# Patient Record
Sex: Male | Born: 1955 | Race: White | Hispanic: No | Marital: Married | Smoking: Former smoker
Health system: Southern US, Community
[De-identification: ages and names within clinical notes are randomized; demographics above are authoritative.]

## PROBLEM LIST (undated history)

## (undated) DIAGNOSIS — E781 Pure hyperglyceridemia: Secondary | ICD-10-CM

## (undated) DIAGNOSIS — I639 Cerebral infarction, unspecified: Secondary | ICD-10-CM

## (undated) DIAGNOSIS — R519 Headache, unspecified: Secondary | ICD-10-CM

## (undated) DIAGNOSIS — I10 Essential (primary) hypertension: Secondary | ICD-10-CM

## (undated) DIAGNOSIS — T7840XA Allergy, unspecified, initial encounter: Secondary | ICD-10-CM

## (undated) DIAGNOSIS — I6529 Occlusion and stenosis of unspecified carotid artery: Secondary | ICD-10-CM

## (undated) HISTORY — PX: HERNIA REPAIR: SHX51

## (undated) HISTORY — DX: Pure hyperglyceridemia: E78.1

## (undated) HISTORY — DX: Allergy, unspecified, initial encounter: T78.40XA

## (undated) HISTORY — DX: Occlusion and stenosis of unspecified carotid artery: I65.29

---

## 2008-07-07 HISTORY — PX: EYE SURGERY: SHX253

## 2009-08-27 ENCOUNTER — Ambulatory Visit: Payer: Self-pay | Admitting: Cardiology

## 2009-09-12 ENCOUNTER — Ambulatory Visit: Payer: Self-pay | Admitting: Vascular Surgery

## 2010-04-17 ENCOUNTER — Ambulatory Visit: Payer: Self-pay | Admitting: Vascular Surgery

## 2010-11-19 NOTE — Procedures (Signed)
CAROTID DUPLEX EXAM   INDICATION:  Follow up carotid artery disease.   HISTORY:  Diabetes:  No.  Cardiac:  No.  Hypertension:  Yes.  Smoking:  No.  Previous Surgery:  No.  CV History:  Blurred vision in the left eye.  Passed out at work on  08/26/09.  Amaurosis Fugax , Paresthesias , Hemiparesis                                       RIGHT             LEFT  Brachial systolic pressure:         138               136  Brachial Doppler waveforms:         WNL               WNL  Vertebral direction of flow:        Antegrade         Antegrade  DUPLEX VELOCITIES (cm/sec)  CCA peak systolic                   78                80  ECA peak systolic                   123               124  ICA peak systolic                   157               81  ICA end diastolic                   66                23  PLAQUE MORPHOLOGY:                  Heterogenous      Heterogenous  PLAQUE AMOUNT:                      Moderate          Mild  PLAQUE LOCATION:                    Proximal ICA      Proximal ICA   IMPRESSION:  Bilateral intimal thickening within common carotid  arteries, 40% to 59% stenosis in the right internal carotid artery, 1%  to 39% stenosis in the left internal carotid artery.   ___________________________________________  Janetta Hora Fields, MD   OD/MEDQ  D:  04/17/2010  T:  04/17/2010  Job:  [id number]

## 2010-11-19 NOTE — Assessment & Plan Note (Signed)
OFFICE VISIT   Patrick [name], Patrick [name]  DOB:  [birth year]                                       09/12/2009  KGMWN#:[id number]   CHIEF COMPLAINT:  Dizziness.   HISTORY OF PRESENT ILLNESS:  The patient is a 55 year old male referred  by Dr. Neita Carp who about 3 weeks ago had a syncopal episode.  At that  time he was taking care of his wife who was having a viral GI type  illness.  He said he was fairly run down at the time and also eventually  himself came down with this viral illness.  He had no seizure activity  noticed at the time of his syncopal episode.  He states he was out for  approximately 20 seconds.  He has had no prior similar episodes.  He  denies any episodes of TIA, amaurosis or stroke.  He denies tobacco  abuse.  He has been on aspirin for several years.   CHRONIC MEDICAL PROBLEMS:  Include hypertension and elevated  cholesterol.  These are both currently controlled and followed by Dr.  Neita Carp.   PAST SURGICAL HISTORY:  He had bilateral inguinal hernia repair.   FAMILY HISTORY:  Remarkable for his father who had coronary artery  disease and a myocardial infarction at age 39.   I reviewed several pages of medical records sent by Dr. Neita Carp including  a carotid duplex exam from [facility] which showed  bilateral 40%-60% internal carotid artery stenosis.  The right vertebral  artery had antegrade flow.  The left vertebral artery had antegrade  flow.  These both had normal waveforms.  He also had an echocardiogram  which showed a normal ejection fraction with some impaired diastolic  relaxation and mild LVH.   PHYSICAL EXAM:  Vital signs:  Blood pressure is 136/84 in the right arm,  155/91 in the left arm, temperature is 98.2, heart rate 73 and regular.  HEENT:  Unremarkable.  Neck:  Has 2+ carotid pulses without bruit.  Chest:  Clear to auscultation.  Cardiac:  Exam is regular rate and  rhythm without murmur.  Abdomen:  Soft,  nontender, nondistended.  No  masses.  Extremities:  He has 2+ femoral pulses, 2+ posterior tibial  pulses bilaterally.  He has absent dorsalis pedis pulses bilaterally.  Musculoskeletal:  Exam shows no major joint deformities.  Neurologic:  Exam shows symmetric upper extremity and lower extremity motor strength  which is 5/5 and symmetric.  Sensory is intact.  He has no pronator  drift.  Cranial nerves II-XII are intact.  Skin:  Has no ulcers or  rashes.  Lymphatic:  Exam shows no cervical, axillary or inguinal  lymphadenopathy.   CURRENT MEDICATIONS:  Include quinapril and hydrochlorothiazide.  He  also takes aspirin.   ALLERGIES:  He has no known drug allergies.   In summary, the patient has bilateral moderate carotid stenoses.  I do  not believe these are the cause of his syncopal episode and I agree with  his primary physician that this most likely was related to his viral  illness rather than carotid disease.  However, I think the carotid  stenosis warrants continued followup and will schedule him for a repeat  carotid duplex ultrasound in six months' time to make sure he has no  progression of the disease.  He will continue to take his  aspirin  therapy and try to keep his blood pressure and cholesterol in check.     Janetta Hora. Fields, MD  Electronically Signed   CEF/MEDQ  D:  09/13/2009  T:  09/13/2009  Job:  [id number]   cc:   Fara Chute

## 2011-01-21 DIAGNOSIS — E7800 Pure hypercholesterolemia, unspecified: Secondary | ICD-10-CM | POA: Insufficient documentation

## 2011-04-17 ENCOUNTER — Other Ambulatory Visit: Payer: Self-pay

## 2011-06-02 ENCOUNTER — Other Ambulatory Visit: Payer: Self-pay

## 2011-07-12 ENCOUNTER — Encounter: Payer: Self-pay | Admitting: *Deleted

## 2011-07-12 ENCOUNTER — Other Ambulatory Visit: Payer: Self-pay

## 2011-07-12 ENCOUNTER — Emergency Department (HOSPITAL_COMMUNITY): Payer: BC Managed Care – PPO

## 2011-07-12 ENCOUNTER — Observation Stay (HOSPITAL_COMMUNITY)
Admission: EM | Admit: 2011-07-12 | Discharge: 2011-07-13 | Disposition: A | Discharge status: Home / Self Care | Payer: BC Managed Care – PPO | Attending: Internal Medicine | Admitting: Internal Medicine

## 2011-07-12 DIAGNOSIS — R209 Unspecified disturbances of skin sensation: Secondary | ICD-10-CM | POA: Insufficient documentation

## 2011-07-12 DIAGNOSIS — E876 Hypokalemia: Secondary | ICD-10-CM | POA: Insufficient documentation

## 2011-07-12 DIAGNOSIS — I10 Essential (primary) hypertension: Secondary | ICD-10-CM | POA: Insufficient documentation

## 2011-07-12 DIAGNOSIS — R29810 Facial weakness: Secondary | ICD-10-CM | POA: Insufficient documentation

## 2011-07-12 DIAGNOSIS — R42 Dizziness and giddiness: Secondary | ICD-10-CM | POA: Insufficient documentation

## 2011-07-12 DIAGNOSIS — G459 Transient cerebral ischemic attack, unspecified: Principal | ICD-10-CM | POA: Insufficient documentation

## 2011-07-12 DIAGNOSIS — R7309 Other abnormal glucose: Secondary | ICD-10-CM | POA: Insufficient documentation

## 2011-07-12 HISTORY — DX: Essential (primary) hypertension: I10

## 2011-07-12 LAB — RAPID URINE DRUG SCREEN, HOSP PERFORMED
Barbiturates: NOT DETECTED
Benzodiazepines: NOT DETECTED
Tetrahydrocannabinol: NOT DETECTED

## 2011-07-12 LAB — BASIC METABOLIC PANEL
CO2: 30 mEq/L (ref 19–32)
Chloride: 97 mEq/L (ref 96–112)
Sodium: 136 mEq/L (ref 135–145)

## 2011-07-12 LAB — CBC
HCT: 45 % (ref 39.0–52.0)
Platelets: 210 10*3/uL (ref 150–400)
RDW: 12.6 % (ref 11.5–15.5)
WBC: 4.6 10*3/uL (ref 4.0–10.5)

## 2011-07-12 LAB — DIFFERENTIAL
Basophils Absolute: 0 10*3/uL (ref 0.0–0.1)
Lymphocytes Relative: 39 % (ref 12–46)
Neutro Abs: 2.2 10*3/uL (ref 1.7–7.7)
Neutrophils Relative %: 48 % (ref 43–77)

## 2011-07-12 LAB — URINALYSIS, ROUTINE W REFLEX MICROSCOPIC
Bilirubin Urine: NEGATIVE
Ketones, ur: NEGATIVE mg/dL
Leukocytes, UA: NEGATIVE
Nitrite: NEGATIVE
Specific Gravity, Urine: 1.015 (ref 1.005–1.030)
Urobilinogen, UA: 0.2 mg/dL (ref 0.0–1.0)
pH: 7 (ref 5.0–8.0)

## 2011-07-12 LAB — POCT I-STAT TROPONIN I: Troponin i, poc: 0 ng/mL (ref 0.00–0.08)

## 2011-07-12 LAB — TROPONIN I: Troponin I: <0.3 ng/mL (ref <0.30)

## 2011-07-12 MED ORDER — QUINAPRIL-HYDROCHLOROTHIAZIDE 20-25 MG PO TABS: 1.0000 | ORAL_TABLET | Freq: Every day | ORAL | Status: DC

## 2011-07-12 MED ORDER — DOXYCYCLINE HYCLATE 100 MG PO TABS
100.0000 mg | ORAL_TABLET | Freq: Two times a day (BID) | ORAL | Status: DC
  Administered 2011-07-12 – 2011-07-13 (×2): 100 mg via ORAL
  Filled 2011-07-12 (×2): qty 1

## 2011-07-12 MED ORDER — POTASSIUM CHLORIDE 20 MEQ/15ML (10%) PO LIQD
40.0000 meq | Freq: Once | ORAL | Status: AC
  Administered 2011-07-12: 40 meq via ORAL
  Filled 2011-07-12: qty 30

## 2011-07-12 MED ORDER — LISINOPRIL 10 MG PO TABS
20.0000 mg | ORAL_TABLET | Freq: Every day | ORAL | Status: DC
  Administered 2011-07-12 – 2011-07-13 (×2): 20 mg via ORAL
  Filled 2011-07-12: qty 1
  Filled 2011-07-12: qty 2
  Filled 2011-07-12: qty 1

## 2011-07-12 MED ORDER — ACETAMINOPHEN 325 MG PO TABS
650.0000 mg | ORAL_TABLET | ORAL | Status: DC | PRN
  Administered 2011-07-13: 650 mg via ORAL
  Filled 2011-07-12: qty 2

## 2011-07-12 MED ORDER — ENOXAPARIN SODIUM 40 MG/0.4ML ~~LOC~~ SOLN
40.0000 mg | SUBCUTANEOUS | Status: DC
  Filled 2011-07-12: qty 0.4

## 2011-07-12 MED ORDER — HYDROCHLOROTHIAZIDE 25 MG PO TABS
25.0000 mg | ORAL_TABLET | Freq: Every day | ORAL | Status: DC
  Administered 2011-07-12 – 2011-07-13 (×2): 25 mg via ORAL
  Filled 2011-07-12 (×2): qty 1

## 2011-07-12 MED ORDER — ASPIRIN EC 325 MG PO TBEC
325.0000 mg | DELAYED_RELEASE_TABLET | Freq: Every day | ORAL | Status: DC
  Administered 2011-07-12 – 2011-07-13 (×2): 325 mg via ORAL
  Filled 2011-07-12 (×2): qty 1

## 2011-07-12 MED ORDER — SODIUM CHLORIDE 0.9 % IV SOLN
INTRAVENOUS | Status: DC
  Administered 2011-07-12 – 2011-07-13 (×2): 950 mL via INTRAVENOUS

## 2011-07-12 NOTE — H&P (Signed)
PCP:   Estanislado Pandy, MD   Chief Complaint:  Generalized paresthesias and difficulty with speech  HPI: This is a pleasant 56 year old gentleman who has a history of hypertension. He denies any history of stroke or coronary disease. He was out to lunch with his family today when he noted that he had a feeling of generalized paresthesias over his entire body. He subsequently got very dizzy and felt like he was given a pass out. He noticed some blurry vision during this episode. He reports the episode lasted approximately 10 minutes. He is brought to the emergency room for evaluation. His wife reports that she noted the left side of his mouth begin group. She also noticed that his lips were covering. His family noted that he was having difficulty with his speech and began to significantly stutter. To report that the entire episode lasted approximately 2 hours. Since that time his symptoms have completely resolved. He feels back to baseline. He reports having a similar episode approximately 2 years ago where he had passed out for a few minutes. At that time he had carotid Dopplers done which showed about a 50% stenosis. His symptoms were attribute it to dehydration since he had a concurrent GI virus at that time. He has been referred for admission for further workup.  Allergies:  No Known Allergies    Past Medical History  Diagnosis Date  . Hypertension     Past Surgical History  Procedure Date  . Hernia repair     Prior to Admission medications   Medication Sig Start Date End Date Taking? Authorizing Provider  aspirin EC 81 MG tablet Take 81 mg by mouth daily.     Yes Historical Provider, MD  DM-Doxylamine-Acetaminophen (VICKS NYQUIL COLD & FLU) 15-6.25-325 MG CAPS Take 1 capsule by mouth daily as needed. For cough/congestion    Yes Historical Provider, MD  doxycycline (VIBRA-TABS) 100 MG tablet Take 100 mg by mouth 2 (two) times daily. Started on 07/08/11. Therapy is for 10 days.    Yes  Historical Provider, MD  guaiFENesin-codeine (ROBITUSSIN AC) 100-10 MG/5ML syrup Take 5 mLs by mouth 3 (three) times daily as needed. For cough     Yes Historical Provider, MD  quinapril-hydrochlorothiazide (ACCURETIC) 20-25 MG per tablet Take 1 tablet by mouth daily.     Yes Historical Provider, MD    Social History:  reports that he quit smoking about 36 years ago. He does not have any smokeless tobacco history on file. He reports that he drinks about 1.8 ounces of alcohol per week. He reports that he does not use illicit drugs.  History reviewed. No pertinent family history.  Review of Systems: Positives are in bold. Constitutional: Denies fever, chills, diaphoresis, appetite change and fatigue.  HEENT: Denies photophobia, eye pain, redness, hearing loss, ear pain, congestion, sore throat, rhinorrhea, sneezing, mouth sores, trouble swallowing, neck pain, neck stiffness and tinnitus.   Respiratory: Denies SOB, DOE, cough, chest tightness,  and wheezing.   Cardiovascular: Denies chest pain, palpitations and leg swelling.  Gastrointestinal: Denies nausea, vomiting, abdominal pain, diarrhea, constipation, blood in stool and abdominal distention.  Genitourinary: Denies dysuria, urgency, frequency, hematuria, flank pain and difficulty urinating.  Musculoskeletal: Denies myalgias, back pain, joint swelling, arthralgias and gait problem.  Skin: Denies pallor, rash and wound.  Neurological: Denies dizziness, seizures, syncope, weakness, light-headedness, numbness and headaches.  Hematological: Denies adenopathy. Easy bruising, personal or family bleeding history  Psychiatric/Behavioral: Denies suicidal ideation, mood changes, confusion, nervousness, sleep disturbance and agitation  Physical Exam: Blood pressure 131/88, pulse 73, resp. rate 14, height 5\' 9"  (1.753 m), weight 83.915 kg (185 lb), SpO2 99.00%. General: Patient is in no acute distress, lying comfortably in bed, alert and oriented  x3 HEENT: Normocephalic, atraumatic, right pupil is reactive to light, left pupil does not constrict (chronic issue) Neck: Supple Chest: Clear to auscultation bilaterally Cardiac: S1, S2, regular rate and rhythm Abdomen: Soft, nontender, nondistended, bowel sounds active Extremities: No edema, cyanosis, clubbing Neurologic: 5 over 5 strength bilaterally in the upper and lower extremities. Cranial nerves appear grossly intact. No focal deficits. No facial asymmetry Skin: Warm, intact   Labs on Admission:  Results for orders placed during the hospital encounter of 07/12/11 (from the past 48 hour(s))  POCT I-STAT TROPONIN I     Status: Normal   Collection Time   07/12/11 10:37 AM      Component Value Range Comment   Troponin i, poc 0.00  0.00 - 0.08 (ng/mL)    Comment 3            CBC     Status: Normal   Collection Time   07/12/11 10:56 AM      Component Value Range Comment   WBC 4.6  4.0 - 10.5 (K/uL)    RBC 4.94  4.22 - 5.81 (MIL/uL)    Hemoglobin 15.6  13.0 - 17.0 (g/dL)    HCT 21.3  08.6 - 57.8 (%)    MCV 91.1  78.0 - 100.0 (fL)    MCH 31.6  26.0 - 34.0 (pg)    MCHC 34.7  30.0 - 36.0 (g/dL)    RDW 46.9  62.9 - 52.8 (%)    Platelets 210  150 - 400 (K/uL)   DIFFERENTIAL     Status: Normal   Collection Time   07/12/11 10:56 AM      Component Value Range Comment   Neutrophils Relative 48  43 - 77 (%)    Neutro Abs 2.2  1.7 - 7.7 (K/uL)    Lymphocytes Relative 39  12 - 46 (%)    Lymphs Abs 1.8  0.7 - 4.0 (K/uL)    Monocytes Relative 10  3 - 12 (%)    Monocytes Absolute 0.4  0.1 - 1.0 (K/uL)    Eosinophils Relative 3  0 - 5 (%)    Eosinophils Absolute 0.1  0.0 - 0.7 (K/uL)    Basophils Relative 1  0 - 1 (%)    Basophils Absolute 0.0  0.0 - 0.1 (K/uL)   BASIC METABOLIC PANEL     Status: Abnormal   Collection Time   07/12/11 10:56 AM      Component Value Range Comment   Sodium 136  135 - 145 (mEq/L)    Potassium 3.1 (*) 3.5 - 5.1 (mEq/L)    Chloride 97  96 - 112 (mEq/L)    CO2  30  19 - 32 (mEq/L)    Glucose, Bld 109 (*) 70 - 99 (mg/dL)    BUN 20  6 - 23 (mg/dL)    Creatinine, Ser 4.13  0.50 - 1.35 (mg/dL)    Calcium 24.4  8.4 - 10.5 (mg/dL)    GFR calc non Af Amer 71 (*) >90 (mL/min)    GFR calc Af Amer 82 (*) >90 (mL/min)   TROPONIN I     Status: Normal   Collection Time   07/12/11 10:56 AM      Component Value Range Comment   Troponin I <0.30  <  0.30 (ng/mL)     Radiological Exams on Admission: Ct Head Wo Contrast  07/12/2011  *RADIOLOGY REPORT*  Clinical Data: Slurred speech and numbness  CT HEAD WITHOUT CONTRAST  Technique:  Contiguous axial images were obtained from the base of the skull through the vertex without contrast.  Comparison: None.  Findings: The ventricles are normal in size, shape, and position. There is no mass effect or midline shift.  No acute hemorrhage or abnormal extra-axial fluid collections are identified.  The gray/white differentiation is normal.  The orbits, calvarium, visualized paranasal sinuses have a normal appearance.  IMPRESSION: Normal CT scan of the head with no evidence of acute intracranial abnormality.  Original Report Authenticated By: Brandon Melnick, M.D.    Assessment/Plan Active Problems:  TIA (transient ischemic attack)  Hypokalemia  HTN (hypertension)  Plan:  Patient is admitted to a telemetry unit. He will undergo further workup for TIA including MRI/MRA of the brain as well as a 2-D echocardiogram patient has known carotid disease and follows with a vascular surgeon. We will repeat carotid Dopplers. We'll change his aspirin from 81 mg to 325 mg. We will provide gentle hydration. Monitor for any recurrence of symptoms. We'll continue his antihypertensives.  replace potassium. Further orders per the clinical course.  Time Spent on Admission:  Caidyn Blossom [facility] 07/12/2011, 5:43 PM

## 2011-07-12 NOTE — ED Notes (Signed)
Pt has no difficulty with swallow test.

## 2011-07-12 NOTE — ED Notes (Signed)
Pt states that he was sitting and eating and suddenly developed shortness of breath and numbness in his arms. Pt also states that his speech was slurred. Pt able to smile, squint eyes and stick out tongue with symmetry. Pt has strong hand grips and able to raise both legs off of stretcher.

## 2011-07-12 NOTE — ED Provider Notes (Addendum)
History   This chart was scribed for Dione Booze, MD by Charolett Bumpers . The patient was seen in room APA10/[facility]10 and the patient's care was started at 10:40am.  CSN: [id number]  Arrival date & time 07/12/11  1020   First MD Initiated Contact with Patient 07/12/11 1029      Chief Complaint  Patient presents with  . Numbness    (Consider location/radiation/quality/duration/timing/severity/associated sxs/prior treatment) HPI Patrick [name] is a 56 y.o. male who presents to the Emergency Department complaining of constant, moderate generalized numbness that started 20 minutes prior to arrival. Patient was eating when he noticed the numb feeling, and stated he felt near syncope. He had associated slurred speech, blurry vision, diaphoresis, and generalized weakness. Negative for headache, numbness in feet, and chest pain. Wife stated the patient's words were drawn out at times. The patient states he currently feels back to baseline. The patient is currently being treated for a sinus infection and has a  h/o hypertension and a 50% blockage in carotid artery. The patient has had one prior syncope episode before about a year ago.   Past Medical History  Diagnosis Date  . Hypertension     Past Surgical History  Procedure Date  . Hernia repair     History reviewed. No pertinent family history.  History  Substance Use Topics  . Smoking status: Former Games developer  . Smokeless tobacco: Not on file  . Alcohol Use: Yes     occasional      Review of Systems A complete 10 system review of systems was obtained and is otherwise negative except as noted in the HPI and PMH.   Allergies  Review of patient's allergies indicates no known allergies.  Home Medications  No current outpatient prescriptions on file.  Ht 5\' 9"  (1.753 m)  Wt 185 lb (83.915 kg)  BMI 27.32 kg/m2 Oxygen saturation is 96% which is normal. Physical Exam  Nursing note and vitals reviewed. Constitutional: He is  oriented to person, place, and time. He appears well-developed and well-nourished. No distress.  HENT:  Head: Normocephalic and atraumatic.  Eyes: EOM are normal. Pupils are equal, round, and reactive to light.       Anisocoria with left pupil 5 mm and right pupil 3 mm.   Neck: Neck supple. No tracheal deviation present.       No carotid bruit   Cardiovascular: Normal rate, regular rhythm and normal heart sounds.   Pulmonary/Chest: Effort normal. No respiratory distress.  Abdominal: Soft. He exhibits no distension.  Musculoskeletal: Normal range of motion. He exhibits no edema.  Neurological: He is alert and oriented to person, place, and time. No cranial nerve deficit (except for pupils) or sensory deficit.       Negative for protonator drift. Strength 5/5   Skin: Skin is warm and dry.  Psychiatric: He has a normal mood and affect. His behavior is normal.  Fundi show no hemorrhage or exudate.  ED Course  Procedures (including critical care time)     Labs Reviewed  POCT I-STAT TROPONIN I   No results found. Results for orders placed during the hospital encounter of 07/12/11  POCT I-STAT TROPONIN I      Component Value Range   Troponin i, poc 0.00  0.00 - 0.08 (ng/mL)   Comment 3           CBC      Component Value Range   WBC 4.6  4.0 - 10.5 (K/uL)   RBC  4.94  4.22 - 5.81 (MIL/uL)   Hemoglobin 15.6  13.0 - 17.0 (g/dL)   HCT 96.2  95.2 - 84.1 (%)   MCV 91.1  78.0 - 100.0 (fL)   MCH 31.6  26.0 - 34.0 (pg)   MCHC 34.7  30.0 - 36.0 (g/dL)   RDW 32.4  40.1 - 02.7 (%)   Platelets 210  150 - 400 (K/uL)  DIFFERENTIAL      Component Value Range   Neutrophils Relative 48  43 - 77 (%)   Neutro Abs 2.2  1.7 - 7.7 (K/uL)   Lymphocytes Relative 39  12 - 46 (%)   Lymphs Abs 1.8  0.7 - 4.0 (K/uL)   Monocytes Relative 10  3 - 12 (%)   Monocytes Absolute 0.4  0.1 - 1.0 (K/uL)   Eosinophils Relative 3  0 - 5 (%)   Eosinophils Absolute 0.1  0.0 - 0.7 (K/uL)   Basophils Relative 1  0 -  1 (%)   Basophils Absolute 0.0  0.0 - 0.1 (K/uL)  BASIC METABOLIC PANEL      Component Value Range   Sodium 136  135 - 145 (mEq/L)   Potassium 3.1 (*) 3.5 - 5.1 (mEq/L)   Chloride 97  96 - 112 (mEq/L)   CO2 30  19 - 32 (mEq/L)   Glucose, Bld 109 (*) 70 - 99 (mg/dL)   BUN 20  6 - 23 (mg/dL)   Creatinine, Ser 2.53  0.50 - 1.35 (mg/dL)   Calcium 66.4  8.4 - 10.5 (mg/dL)   GFR calc non Af Amer 71 (*) >90 (mL/min)   GFR calc Af Amer 82 (*) >90 (mL/min)  TROPONIN I      Component Value Range   Troponin I <0.30  <0.30 (ng/mL)   Ct Head Wo Contrast  07/12/2011  *RADIOLOGY REPORT*  Clinical Data: Slurred speech and numbness  CT HEAD WITHOUT CONTRAST  Technique:  Contiguous axial images were obtained from the base of the skull through the vertex without contrast.  Comparison: None.  Findings: The ventricles are normal in size, shape, and position. There is no mass effect or midline shift.  No acute hemorrhage or abnormal extra-axial fluid collections are identified.  The gray/white differentiation is normal.  The orbits, calvarium, visualized paranasal sinuses have a normal appearance.  IMPRESSION: Normal CT scan of the head with no evidence of acute intracranial abnormality.  Original Report Authenticated By: Brandon Melnick, M.D.      No diagnosis found.   Date: 07/12/2011  Rate: 77  Rhythm: normal sinus rhythm  QRS Axis: normal  Intervals: normal  ST/T Wave abnormalities: normal  Conduction Disutrbances:nonspecific intraventricular conduction delay  Narrative Interpretation: Nonspecific intraventricular conduction delay  Old EKG Reviewed: none available  Workup is unremarkable except for mild hypokalemia and he is given a dose of oral potassium. Case is discussed with Dr. Kerry Hough and arrangements are made to admit the patient for her remainder of the TIA workup.  MDM  Probable transient ischemic attack. Old records have been reviewed and he did have a carotid ultrasound which showed  40-60% blockage in the right carotid artery. He had not had an MRI or MRA completed and it was determined that the previous episode of syncope was due to to concurrent neurovirus infection with dehydration. He will need TIA evaluation. Curiosly, his known lesion in the right carotid artery would not explain difficulty speaking as speech centers on the left cerebral hemisphere.   I personally performed  the services described in this documentation, which was scribed in my presence. The recorded information has been reviewed and considered.         Dione Booze, MD 07/12/11 1230  Dione Booze, MD 07/12/11 [phone]

## 2011-07-13 ENCOUNTER — Observation Stay (HOSPITAL_COMMUNITY): Payer: BC Managed Care – PPO

## 2011-07-13 LAB — LIPID PANEL
Cholesterol: 163 mg/dL (ref 0–200)
Total CHOL/HDL Ratio: 5.8 RATIO
Triglycerides: 163 mg/dL — ABNORMAL HIGH (ref <150)
VLDL: 33 mg/dL (ref 0–40)

## 2011-07-13 LAB — GLUCOSE, CAPILLARY
Glucose-Capillary: 106 mg/dL — ABNORMAL HIGH (ref 70–99)
Glucose-Capillary: 101 mg/dL — ABNORMAL HIGH (ref 70–99)

## 2011-07-13 LAB — HEMOGLOBIN A1C: Mean Plasma Glucose: 117 mg/dL — ABNORMAL HIGH (ref <117)

## 2011-07-13 MED ORDER — CLOPIDOGREL BISULFATE 75 MG PO TABS: 75.0000 mg | ORAL_TABLET | Freq: Every day | ORAL | Status: DC

## 2011-07-13 MED ORDER — ROSUVASTATIN CALCIUM 20 MG PO TABS: 20.0000 mg | ORAL_TABLET | Freq: Every day | ORAL | Status: DC

## 2011-07-13 MED ORDER — CLOPIDOGREL BISULFATE 75 MG PO TABS: 75.0000 mg | ORAL_TABLET | Freq: Every day | ORAL | Status: AC

## 2011-07-13 MED ORDER — ROSUVASTATIN CALCIUM 20 MG PO TABS
20.0000 mg | ORAL_TABLET | Freq: Every day | ORAL | Status: DC
  Administered 2011-07-13: 20 mg via ORAL
  Filled 2011-07-13: qty 1

## 2011-07-13 NOTE — Discharge Summary (Signed)
Physician Discharge Summary  Patient ID: Patrick [name] MRN: [id number] DOB/AGE: [birth year] 56 y.o.  Admit date: 07/12/2011 Discharge date: 07/13/2011  Primary Care Physician:  Estanislado Pandy, MD   Discharge Diagnoses:    Active Problems:  TIA (transient ischemic attack)  Hypokalemia  HTN (hypertension)    Current Discharge Medication List    START taking these medications   Details  clopidogrel (PLAVIX) 75 MG tablet Take 1 tablet (75 mg total) by mouth daily with breakfast. Qty: 30 tablet, Refills: 1    rosuvastatin (CRESTOR) 20 MG tablet Take 1 tablet (20 mg total) by mouth daily. Qty: 30 tablet, Refills: 1      CONTINUE these medications which have NOT CHANGED   Details  DM-Doxylamine-Acetaminophen (VICKS NYQUIL COLD & FLU) 15-6.25-325 MG CAPS Take 1 capsule by mouth daily as needed. For cough/congestion     doxycycline (VIBRA-TABS) 100 MG tablet Take 100 mg by mouth 2 (two) times daily. Started on 07/08/11. Therapy is for 10 days.     guaiFENesin-codeine (ROBITUSSIN AC) 100-10 MG/5ML syrup Take 5 mLs by mouth 3 (three) times daily as needed. For cough      quinapril-hydrochlorothiazide (ACCURETIC) 20-25 MG per tablet Take 1 tablet by mouth daily.        STOP taking these medications     aspirin EC 81 MG tablet         Discharge exam: Blood pressure 134/71, pulse 54, temperature 97.6 F (36.4 C), temperature source Oral, resp. rate 18, height 5\' 9"  (1.753 m), weight 83.915 kg (185 lb), SpO2 94.00%. General: No acute distress, ambulating without difficulty, feels back to normal Chest: Clear to auscultation bilaterally Cardiac: S1, S2, regular Abdomen: Soft, nontender, nondistended, bowel sounds are active X-rays: No edema bilaterally Neurologic: Fashion bilaterally, cranial nerves II through XII are grossly intact  Disposition and Follow-up:  Patient will schedule follow up with his primary doctor later this week. He will need an outpatient MRI/MRA brain and a  2D echocardiogram  Consults:  None.   Significant Diagnostic Studies:  Ct Head Wo Contrast  07/12/2011  *RADIOLOGY REPORT*  Clinical Data: Slurred speech and numbness  CT HEAD WITHOUT CONTRAST  Technique:  Contiguous axial images were obtained from the base of the skull through the vertex without contrast.  Comparison: None.  Findings: The ventricles are normal in size, shape, and position. There is no mass effect or midline shift.  No acute hemorrhage or abnormal extra-axial fluid collections are identified.  The gray/white differentiation is normal.  The orbits, calvarium, visualized paranasal sinuses have a normal appearance.  IMPRESSION: Normal CT scan of the head with no evidence of acute intracranial abnormality.  Original Report Authenticated By: Brandon Melnick, M.D.   US Carotid Duplex Bilateral  07/13/2011  *RADIOLOGY REPORT*  Clinical Data: Hypertension, syncope, TIA symptoms  BILATERAL CAROTID DUPLEX ULTRASOUND  Technique: Wallace Cullens scale imaging, color Doppler and duplex ultrasound was performed of bilateral carotid and vertebral arteries in the neck.  Comparison:  08/27/2009  Criteria:  Quantification of carotid stenosis is based on velocity parameters that correlate the residual internal carotid diameter with NASCET-based stenosis levels, using the diameter of the distal internal carotid lumen as the denominator for stenosis measurement.  The following velocity measurements were obtained:                   PEAK SYSTOLIC/END DIASTOLIC RIGHT ICA:  197/53cm/sec CCA:                        111/24cm/sec SYSTOLIC ICA/CCA RATIO:     1.77 DIASTOLIC ICA/CCA RATIO:    2.17 ECA:                        171cm/sec  LEFT ICA:                        138/32cm/sec CCA:                        146/28cm/sec SYSTOLIC ICA/CCA RATIO:     0.94 DIASTOLIC ICA/CCA RATIO:    1.14 ECA:                        157cm/sec  Findings:  RIGHT CAROTID ARTERY: Mild diffuse heterogeneous mixed echogenicity and right  carotid bifurcation plaque formation.  In the right mid ICA, there is velocity elevation measuring 197/52 with associated luminal narrowing.  This meets criteria for a 50 - 69% stenosis.  RIGHT VERTEBRAL ARTERY:  Antegrade  LEFT CAROTID ARTERY: Mild to moderate heterogeneous left carotid plaque formation diffusely.  No hemodynamically significant left ICA stenosis, velocity elevation, or turbulent flow.  Degree of narrowing less than 50%.  LEFT VERTEBRAL ARTERY:  Antegrade  IMPRESSION: Right greater left carotid atherosclerosis.  Right ICA stenosis estimated at 50 - 69%.  Left ICA narrowing less than 50%.  Original Report Authenticated By: Judie Petit. Ruel Favors, M.D.    Brief H and P: For complete details please refer to admission H and P, but in brief This is a pleasant 56 year old gentleman who has a history of hypertension. He denies any history of stroke or coronary disease. He was out to lunch with his family today when he noted that he had a feeling of generalized paresthesias over his entire body. He subsequently got very dizzy and felt like he was given a pass out. He noticed some blurry vision during this episode. He reports the episode lasted approximately 10 minutes. He is brought to the emergency room for evaluation. His wife reports that she noted the left side of his mouth begin group. She also noticed that his lips were covering. His family noted that he was having difficulty with his speech and began to significantly stutter. To report that the entire episode lasted approximately 2 hours. Since that time his symptoms have completely resolved. He feels back to baseline. He reports having a similar episode approximately 2 years ago where he had passed out for a few minutes. At that time he had carotid Dopplers done which showed about a 50% stenosis. His symptoms were attribute it to dehydration since he had a concurrent GI virus at that time. He has been referred for admission for further  workup.     Hospital Course:  Active Problems:  TIA (transient ischemic attack): Patient was admitted with symptoms of dizziness, paresthesias, the family had noted that the left side of his mouth was drooping. Patient's symptoms lasted approximately 2 hours and then resolved. Patient was monitored here in the hospital. Carotid Dopplers were done which showed that the patient had a right-sided ICA stenosis estimated at 50-69%, left ICA narrowing less than 50%. Patient reports that he was taking an aspirin on a daily basis. Unfortunately MRI as well as echocardiogram are not available at this  hospital on weekends. This was explained to the patient and he is requesting to discharge home and have these things done as an outpatient. Patient has been completely asymptomatic since his admission. He feels back to normal. It does not seem unreasonable to do these tests as an outpatient. In any case we will change his aspirin to Plavix since he has had TIA symptoms on aspirin. His fasting lipid panel showed an HDL of 28, and LDL of 102. Patient was started on a statin. His blood pressure seems to be reasonably controlled on his current regimen. Fasting sugar was found to be mildly elevated at 103. I discussed starting medical management versus lifestyle changes with him. He wishes to followup with his primary care doctor and discuss this further. He reports that his primary doctor has been telling him for quite some time to watch his diet because his blood sugars are "borderline". Patient reports that he is already following with a vascular surgeon for surveillance on his carotid stenosis. Patient will be discharged today with close followup with his primary care physician.     Time spent on Discharge:  Signed: Jerryl Holzhauer [facility] 07/13/2011, 3:23 PM

## 2011-07-13 NOTE — Progress Notes (Signed)
Writer discussed and reviewed discharge instructions and medications.  Verbalized understanding.  Encouraged to call with any questions that may arise.  Pt took all belongings and wheel chaired out instable condition via staff member.

## 2011-07-14 ENCOUNTER — Other Ambulatory Visit (HOSPITAL_COMMUNITY): Payer: BC Managed Care – PPO

## 2011-07-28 DIAGNOSIS — J069 Acute upper respiratory infection, unspecified: Secondary | ICD-10-CM | POA: Insufficient documentation

## 2011-07-30 ENCOUNTER — Other Ambulatory Visit (INDEPENDENT_AMBULATORY_CARE_PROVIDER_SITE_OTHER): Payer: BC Managed Care – PPO | Admitting: *Deleted

## 2011-07-30 DIAGNOSIS — I6529 Occlusion and stenosis of unspecified carotid artery: Secondary | ICD-10-CM

## 2011-08-11 ENCOUNTER — Other Ambulatory Visit: Payer: Self-pay | Admitting: *Deleted

## 2011-08-11 DIAGNOSIS — I6529 Occlusion and stenosis of unspecified carotid artery: Secondary | ICD-10-CM

## 2011-08-12 ENCOUNTER — Encounter: Payer: Self-pay | Admitting: Surgery

## 2011-08-12 NOTE — Procedures (Unsigned)
CAROTID DUPLEX EXAM  INDICATION:  Stenosis.  HISTORY: Diabetes:  No Cardiac:  No Hypertension:  Yes Smoking:  No Previous Surgery:  No CV History:  July 12, 2011 TIA with slurred speech Amaurosis Fugax No, Paresthesias No, Hemiparesis No                                      RIGHT             LEFT Brachial systolic pressure:         136               133 Brachial Doppler waveforms:         Normal            Normal Vertebral direction of flow:        Antegrade         Antegrade DUPLEX VELOCITIES (cm/sec) CCA peak systolic                   87                P = 151, M = 105 ECA peak systolic                   136               109 ICA peak systolic                   154               87 ICA end diastolic                   68                37 PLAQUE MORPHOLOGY:                  Mixed             Mixed PLAQUE AMOUNT:                      Moderate          Minimal PLAQUE LOCATION:                    ICA               Bifurcation, ICA  IMPRESSION: 1. Right internal carotid artery velocities suggest 40% to 59%     stenosis. 2. Left internal carotid artery velocities suggest 1% to 39% stenosis. 3. Antegrade vertebral arteries bilaterally. 4. Stable in comparison to the previous exam.  ___________________________________________ V. Charlena Cross, MD  EM/MEDQ  D:  07/31/2011  T:  07/31/2011  Job:  [id number]

## 2011-08-13 ENCOUNTER — Other Ambulatory Visit: Payer: Self-pay | Admitting: *Deleted

## 2011-08-13 DIAGNOSIS — I6529 Occlusion and stenosis of unspecified carotid artery: Secondary | ICD-10-CM

## 2011-10-13 DIAGNOSIS — J309 Allergic rhinitis, unspecified: Secondary | ICD-10-CM | POA: Insufficient documentation

## 2012-08-02 ENCOUNTER — Other Ambulatory Visit: Payer: Self-pay | Admitting: *Deleted

## 2012-08-02 DIAGNOSIS — I6529 Occlusion and stenosis of unspecified carotid artery: Secondary | ICD-10-CM

## 2012-08-13 ENCOUNTER — Encounter: Payer: Self-pay | Admitting: Surgery

## 2012-08-16 ENCOUNTER — Ambulatory Visit: Payer: BC Managed Care – PPO | Admitting: Surgery

## 2012-08-16 ENCOUNTER — Other Ambulatory Visit: Payer: BC Managed Care – PPO

## 2012-08-27 ENCOUNTER — Encounter: Payer: Self-pay | Admitting: Surgery

## 2012-08-30 ENCOUNTER — Encounter: Payer: Self-pay | Admitting: Surgery

## 2012-08-30 ENCOUNTER — Other Ambulatory Visit (INDEPENDENT_AMBULATORY_CARE_PROVIDER_SITE_OTHER): Payer: BC Managed Care – PPO | Admitting: *Deleted

## 2012-08-30 ENCOUNTER — Ambulatory Visit (INDEPENDENT_AMBULATORY_CARE_PROVIDER_SITE_OTHER): Payer: BC Managed Care – PPO | Admitting: Surgery

## 2012-08-30 VITALS — BP 132/79 | HR 60 | Resp 16 | Ht 69.0 in | Wt 185.0 lb

## 2012-08-30 DIAGNOSIS — I6529 Occlusion and stenosis of unspecified carotid artery: Secondary | ICD-10-CM | POA: Insufficient documentation

## 2012-08-30 NOTE — Addendum Note (Signed)
Addended by: Dannielle Karvonen on: 08/30/2012 04:22 PM   Modules accepted: Orders

## 2012-08-30 NOTE — Progress Notes (Signed)
[facility] [place] HISTORY AND PHYSICAL   CC:  Follow up carotid duplex scan  Referring Provider:  Estanislado Pandy, MD  HPI: This is a 57 y.o. Patrick [name] here for f/u carotid duplex scan.  He originally had syncope, which is why his carotid stenosis was discovered. Today, he denies amaurosis fugax, paresthesias, or hemiparesis.  He states that one or the other arm arm will go to sleep briefly.  He is usually just sitting when this happens.  It lasts briefly and happens 1-2 x / month. Goes right away when he shakes his arms.  Denies any neck injuries.  He states that he takes an occasional baby aspirin.  He is on Crestor for hyperlipidemia.  He does have HTN that is managed with an ACE/HCTZ.  Past Medical History  Diagnosis Date  . Hypertension   . Carotid artery occlusion    Past Surgical History  Procedure Laterality Date  . Hernia repair      No Known Allergies  Current Outpatient Prescriptions  Medication Sig Dispense Refill  . clopidogrel (PLAVIX) 75 MG tablet       . DM-Doxylamine-Acetaminophen (VICKS NYQUIL COLD & FLU) 15-6.25-325 MG CAPS Take 1 capsule by mouth daily as needed. For cough/congestion       . fluticasone (FLONASE) 50 MCG/ACT nasal spray Place 2 sprays into the nose daily.      . quinapril-hydrochlorothiazide (ACCURETIC) 20-25 MG per tablet Take 1 tablet by mouth daily.        . rosuvastatin (CRESTOR) 20 MG tablet Take 20 mg by mouth daily.      Marland Kitchen doxycycline (VIBRA-TABS) 100 MG tablet Take 100 mg by mouth 2 (two) times daily. Started on 07/08/11. Therapy is for 10 days.       Marland Kitchen guaiFENesin-codeine (ROBITUSSIN AC) 100-10 MG/5ML syrup Take 5 mLs by mouth 3 (three) times daily as needed. For cough         No current facility-administered medications for this visit.    Family History  Problem Relation Age of Onset  . Cancer Mother   . Heart attack Father     History   Social History  . Marital Status: Married    Spouse Name: N/A   Number of Children: N/A  . Years of Education: N/A   Occupational History  . Not on file.   Social History Main Topics  . Smoking status: Former Smoker    Quit date: 09/09/1974  . Smokeless tobacco: Never Used  . Alcohol Use: 1.8 oz/week    3 Cans of beer per week     Comment: occasional  . Drug Use: No  . Sexually Active: Yes    Birth Control/ Protection: None   Other Topics Concern  . Not on file   Social History Narrative  . No narrative on file     ROS: [x]  Positive   [ ]  Negative   [ X] All sytems reviewed and are negative  General: [ ]  Weight loss, [ ]  Weight gain, [ ]   Loss of appetite, [ ]  Fever Neurologic: [ ]  Dizziness, [ ]  Blackouts, [ ]  Headaches, [ ]  Seizure, Stroke, [ ]  "Mini stroke", [ ]  Slurred speech, [ ]  Temporary blindness Ear/Nose/Throat: [ ]  Change in eyesight, [ ]  Change in hearing, [ ]  Nose bleeds, [ ]  Sore throat Vascular: [ ]  Pain in legs with walking, [ ]  Pain in feet while lying flat, [ ]  Non-healing ulcer,  [ ]  Blood clot in vein, [ ]   Phlebitis Pulmonary: [ ]  Home oxygen, [ ]  Productive cough, [ ]  Bronchitis, [ ]  Coughing up blood,  [ ]  Asthma, [ ]  Wheezing Musculoskeletal: [ ]  Arthritis, [ ]  Joint pain, [ ]  Muscle pain Cardiac: [ ]  Chest pain, [ ]  Chest tightness/pressure, [ ]  Shortness of breath when lying flat, [ ]  Shortness of breath with exertion, [ ]  Palpitations, [ ]  Heart murmur, [ ]  Arrythmia,  [ ]  Atrial fibrillation Hematologic: [ ]  Bleeding problems, [ ]  Clotting disorder, [ ]  Anemia Psychiatric:  [ ]  Depression, [ ]  Anxiety Gastrointestinal:  [ ]  Black stool,[ ]   Blood in stool, [ ]  Peptic ulcer disease, [ ]  Reflux, [ ]  Hiatal hernia, [ ]  Trouble swallowing, [ ]  Diarrhea, [ ]  Constipation Urinary:  [ ]  Kidney disease, [ ]  Burning with urination, [ ]  nocturia, [ ]  Difficulty urinating Endocrine: [ ]  hx diabetes, [ ]  hx thyroid disease Skin: [ ]  Ulcers, [ ]  Rashes   PHYSICAL EXAMINATION:  Filed Vitals:   08/30/12 1510  BP: 132/79   Pulse: 60  Resp:    Body mass index is 27.31 kg/(m^2).  General:  WDWN in NAD Gait: Normal HENT: WNL Eyes: PERRL Pulmonary: normal non-labored breathing , without Rales, rhonchi,  wheezing Cardiac: RRR, without  Murmurs, rubs or gallops; No carotid bruits Abdomen: soft, NT, no masses Skin: no rashes, ulcers noted Vascular Exam/Pulses: No carotid bruits are present.  + palpable bilateral radial pulses.  BLE without edema Extremities: without ischemic changes, no Gangrene , no cellulitis; no open wounds;  Musculoskeletal: no muscle wasting or atrophy  Neurologic: A&O X 3; Appropriate Affect ; SENSATION: normal; MOTOR FUNCTION:  moving all extremities equally. Speech is fluent/normal   Non-Invasive Vascular Imaging: Carotid Duplex Scan:  08/30/2012  1. Evidence of 40-59% RICA stenosis 2. Evidence of < 40% LICA stenosis 3. Bilateral verterbral artery is antegrade  ASSESSMENT/PLAN: 58 y.o. Patrick [name] here for f/u carotid duplex scan.   -pt is doing well without any neurological symptoms -Instructed the pt that it is essential to continue the crestor -d/w pt that he may discontinue the plavix, but will need to take ASA Patrick mg daily HTN-continue ACEI/HCTZ -f/u in one year with carotid duplex  [name], PA-C [facility] [phone]  Clinic MD:   Myra Gianotti     I agree with the above. The patient has been seen and examined. He has moderate right-sided carotid disease. This remains asymptomatic. He will continue routine surveillance imaging. The neck study will be in one year. Patient is asking to stop his Plavix. This was not started by myself. I would have no problem with him remaining on single agent antiplatelet therapy, consisting of a baby aspirin.  Durene Cal

## 2013-05-27 IMAGING — CT CT HEAD W/O CM
1 series · 16 of 30 positions shown, 20 images · non-contrast
Comparison: None.

CLINICAL DATA: Slurred speech and numbness

CT HEAD WITHOUT CONTRAST
TECHNIQUE: Contiguous axial images were obtained from the base of
the skull through the vertex without contrast.

[Series 2: headtrauma 4.8 h37s · axial · 0.43mm/px · z∈[+89,+224]mm · 16 of 30 slices shown, 20 images]
[im 2/30  brain]
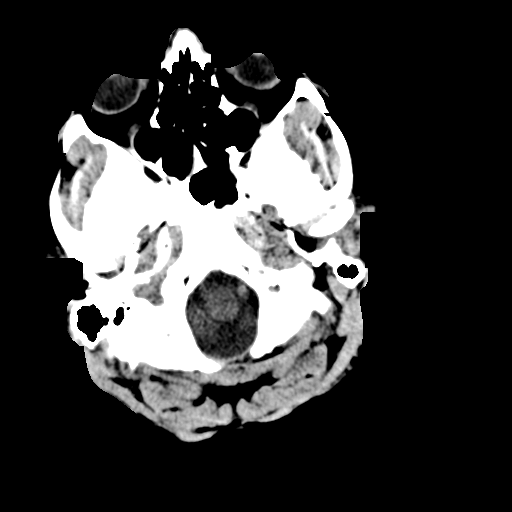
[im 2/30  bone]
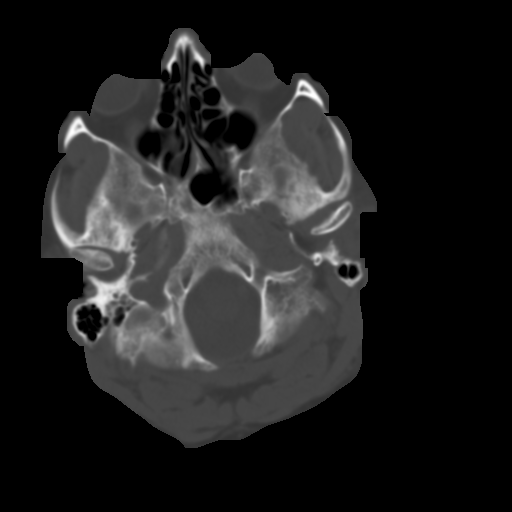
[im 4/30  brain]
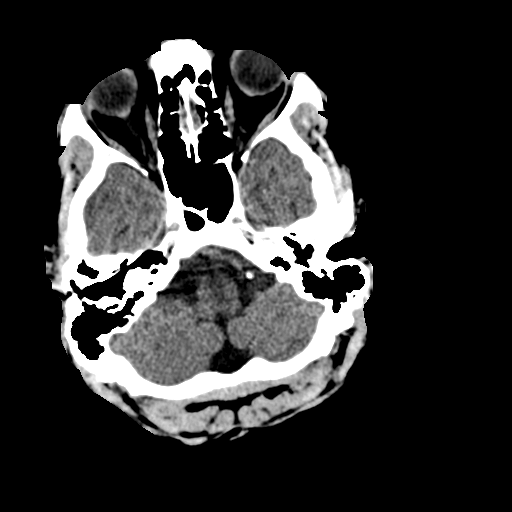
[im 6/30  brain]
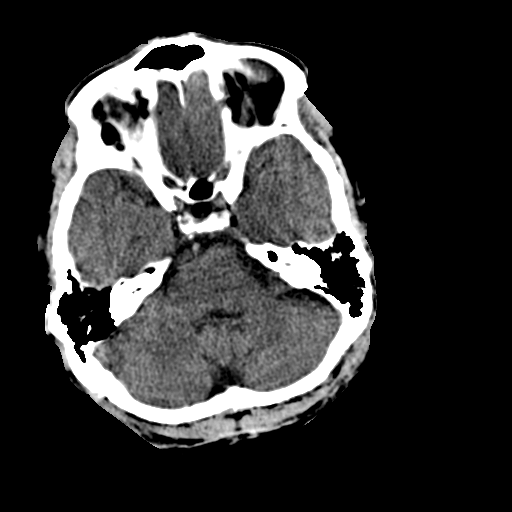
[im 8/30  brain]
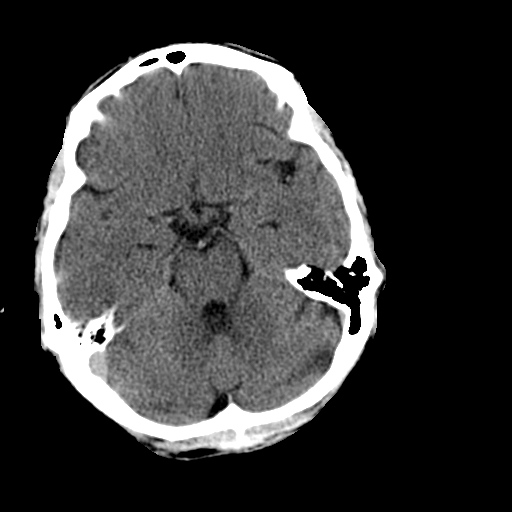
[im 9/30  brain]
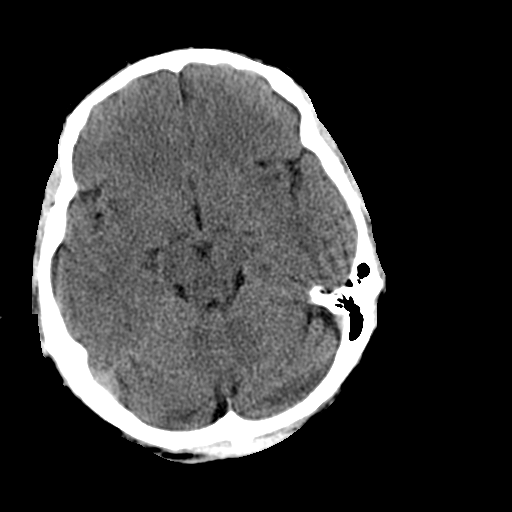
[im 9/30  bone]
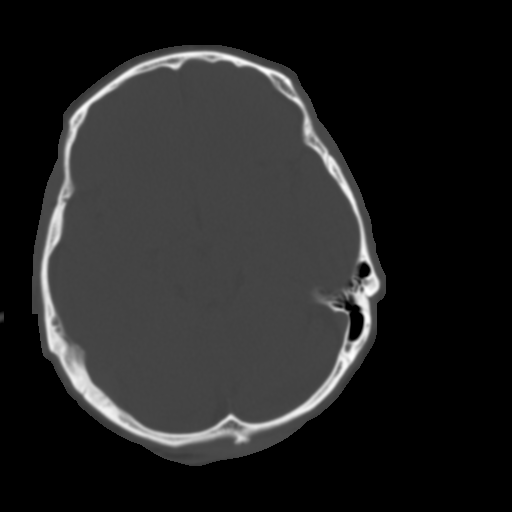
[im 11/30  brain]
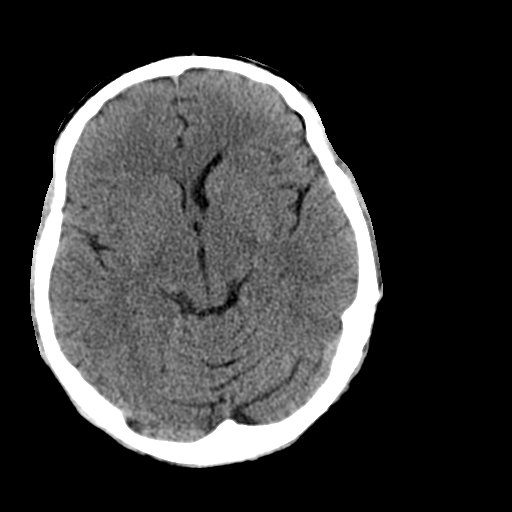
[im 13/30  brain]
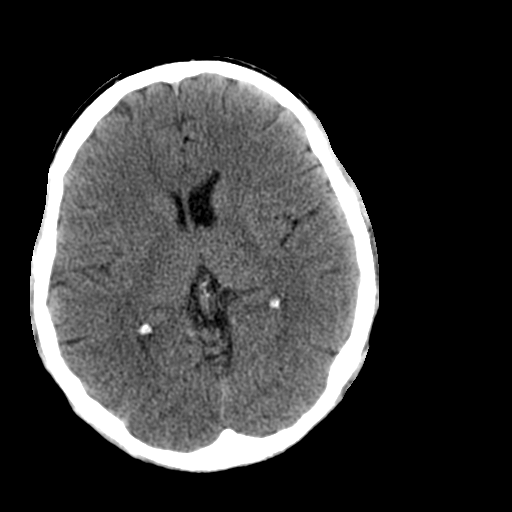
[im 15/30  brain]
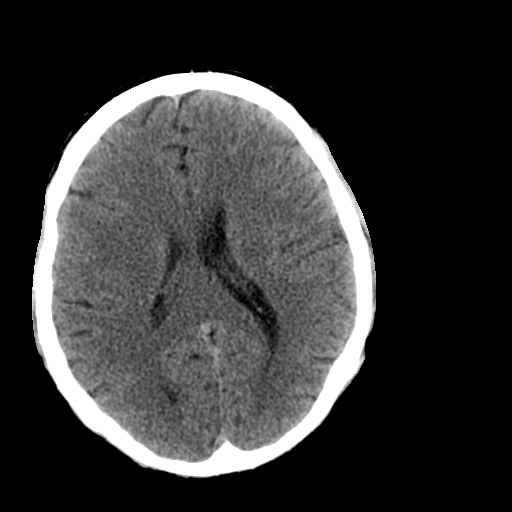
[im 16/30  brain]
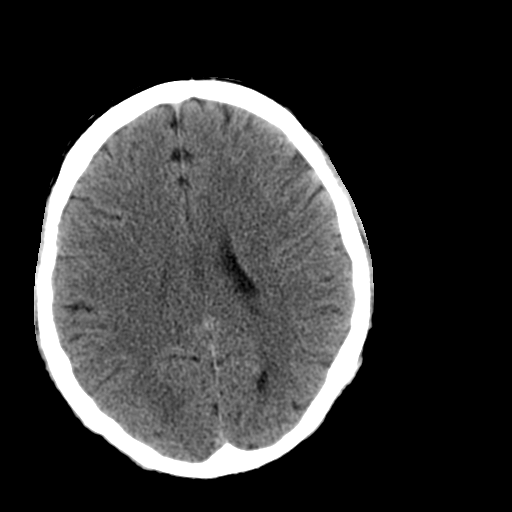
[im 16/30  bone]
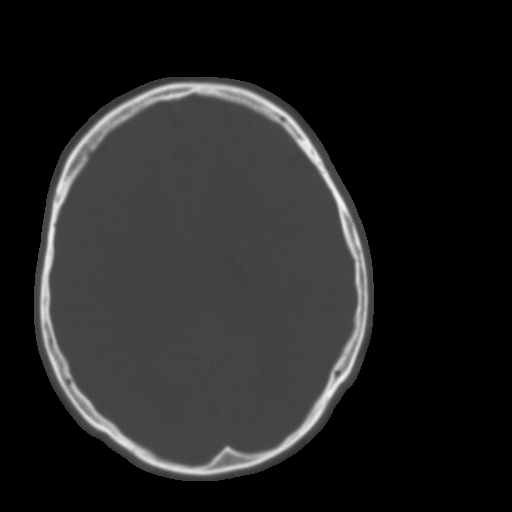
[im 18/30  brain]
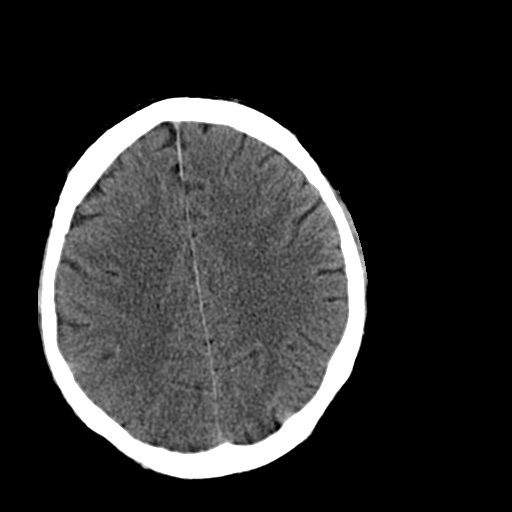
[im 20/30  brain]
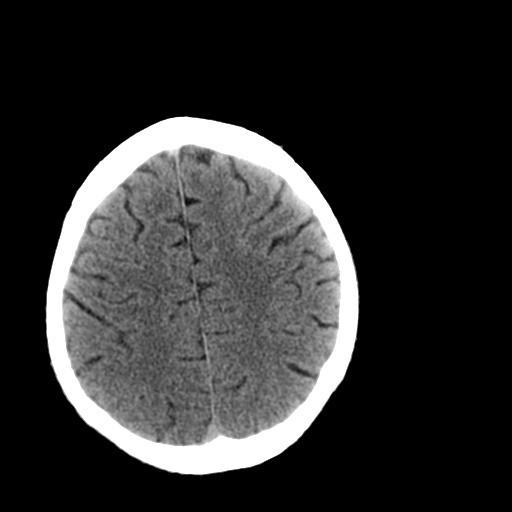
[im 22/30  brain]
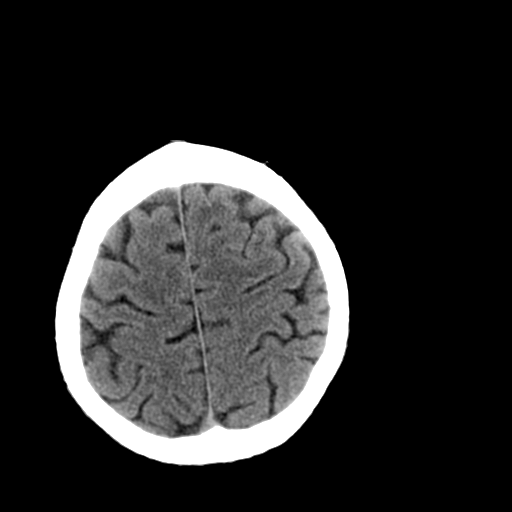
[im 23/30  brain]
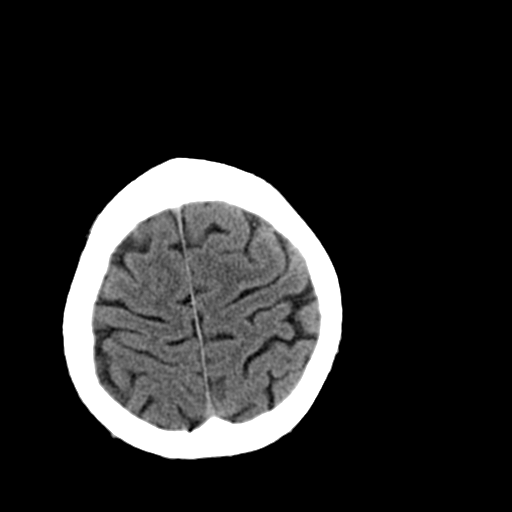
[im 23/30  bone]
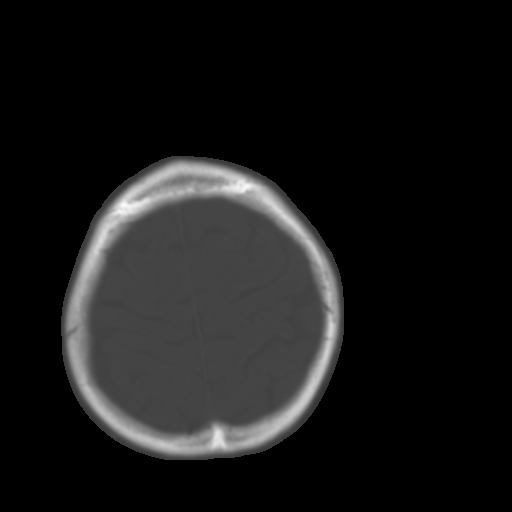
[im 25/30  brain]
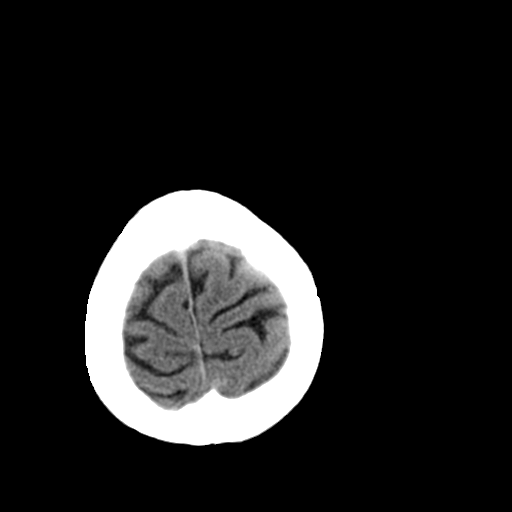
[im 27/30  brain]
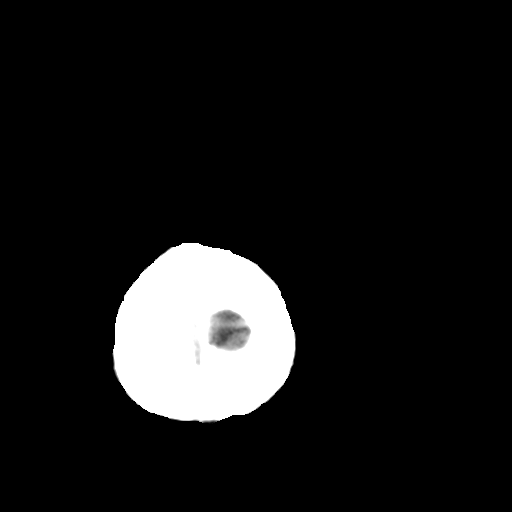
[im 29/30  brain]
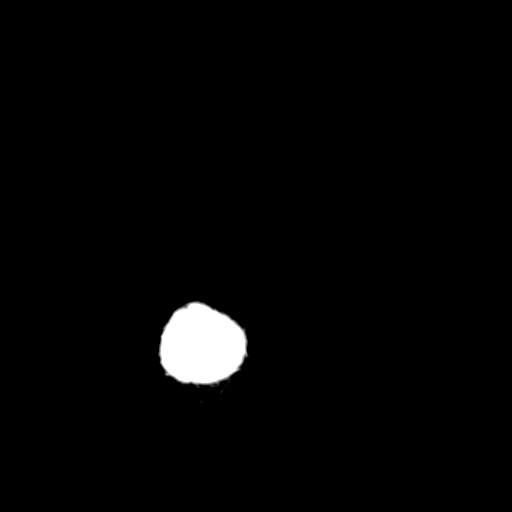

[16 of 30 positions shown; findings below may reference images not displayed]

FINDINGS: The ventricles are normal in size, shape, and position.
There is no mass effect or midline shift.  No acute hemorrhage or
abnormal extra-axial fluid collections are identified.  The
gray/white differentiation is normal.  The orbits, calvarium,
visualized paranasal sinuses have a normal appearance.]
IMPRESSION: Normal CT scan of the head with no evidence of acute intracranial
abnormality.

## 2013-05-28 IMAGING — US US CAROTID DUPLEX BILAT
1 series · 13 of 24 positions shown · non-contrast
Comparison: 08/27/2009

CLINICAL DATA: Hypertension, syncope, TIA symptoms

BILATERAL CAROTID DUPLEX ULTRASOUND
TECHNIQUE: Gray scale imaging, color Doppler and duplex ultrasound
was performed of bilateral carotid and vertebral arteries in the
neck.

[Series 1: us carotid duplex bilat · 0.06mm/px · 13 of 69 slices shown]
[im 1/69]
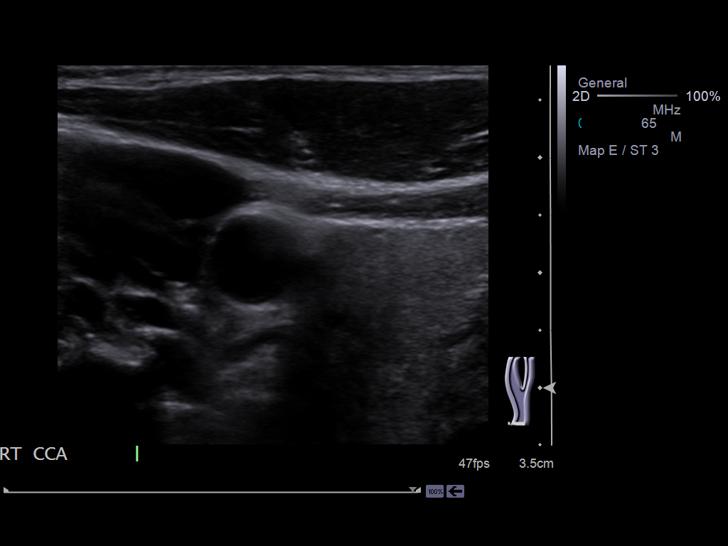
[im 6/69]
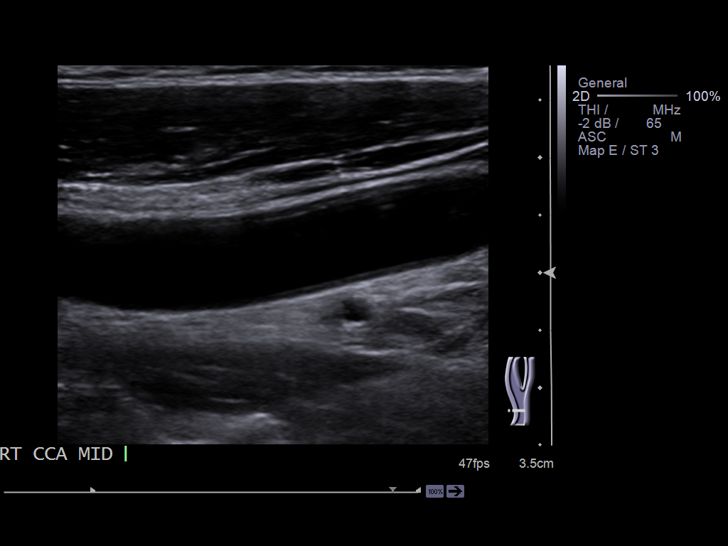
[im 12/69]
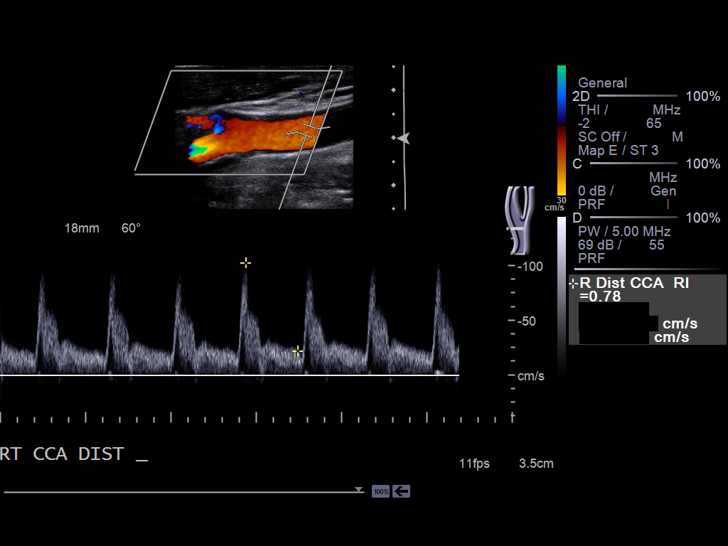
[im 18/69]
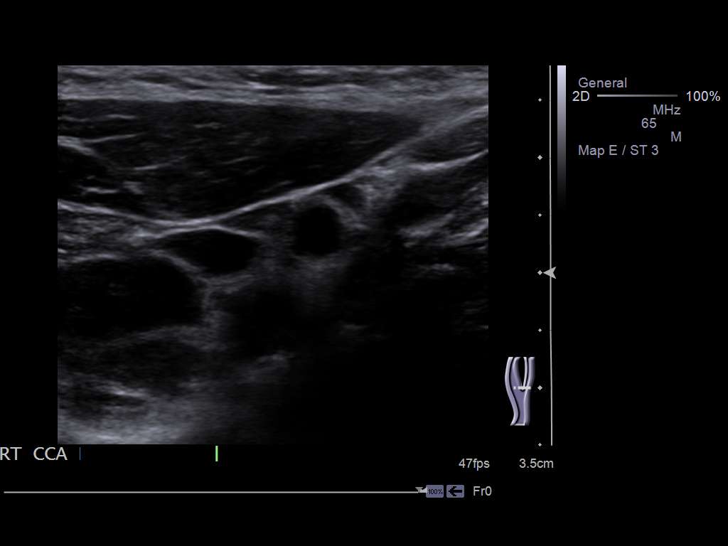
[im 24/69]
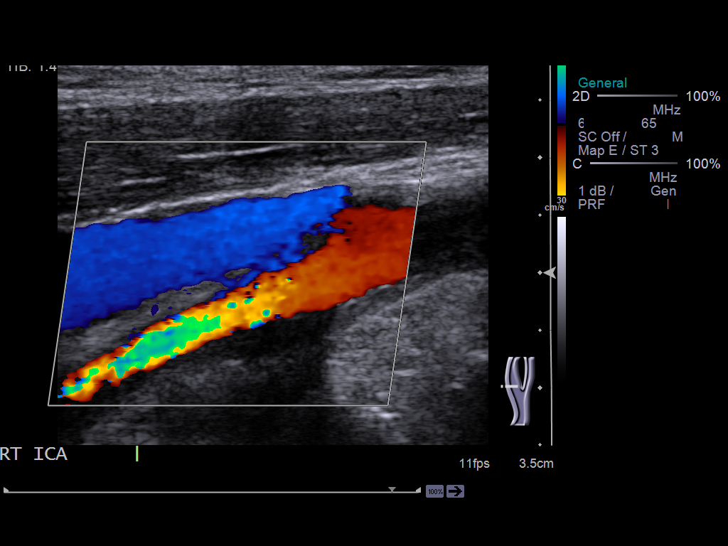
[im 30/69]
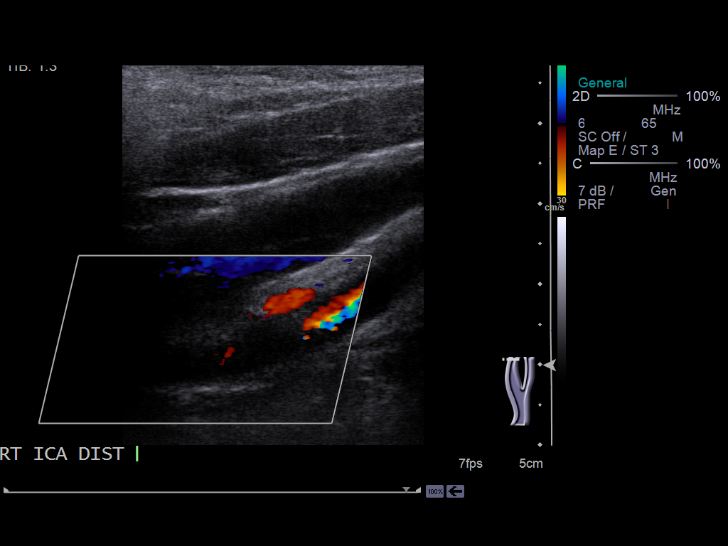
[im 36/69]
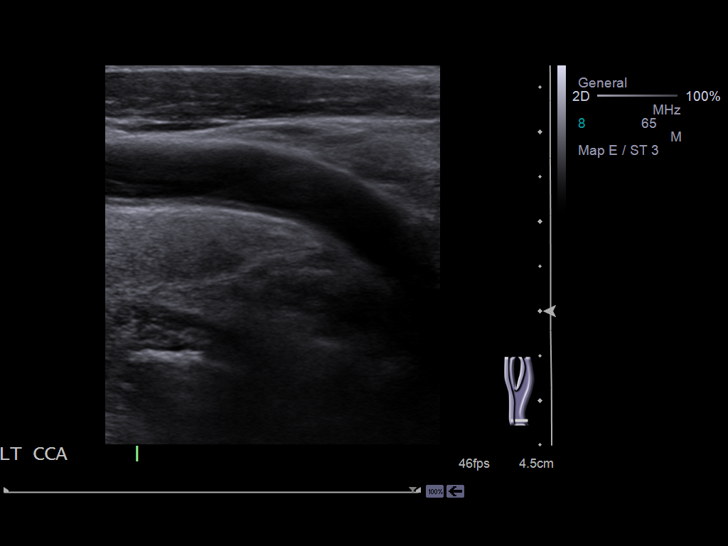
[im 39/69]
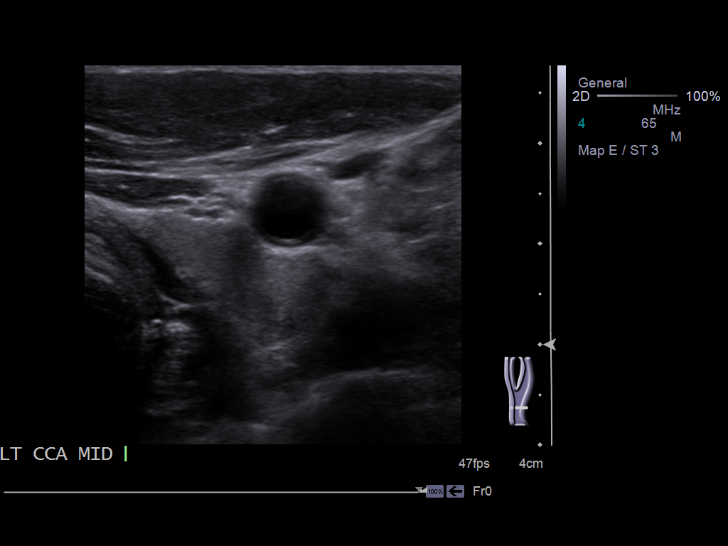
[im 45/69]
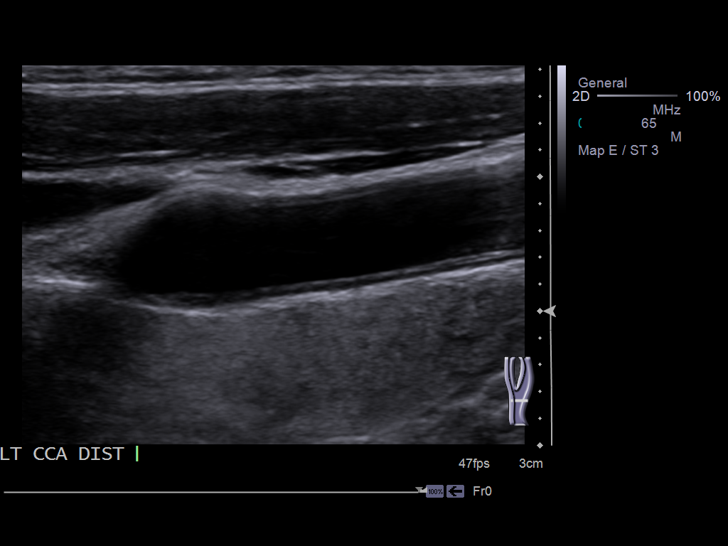
[im 51/69]
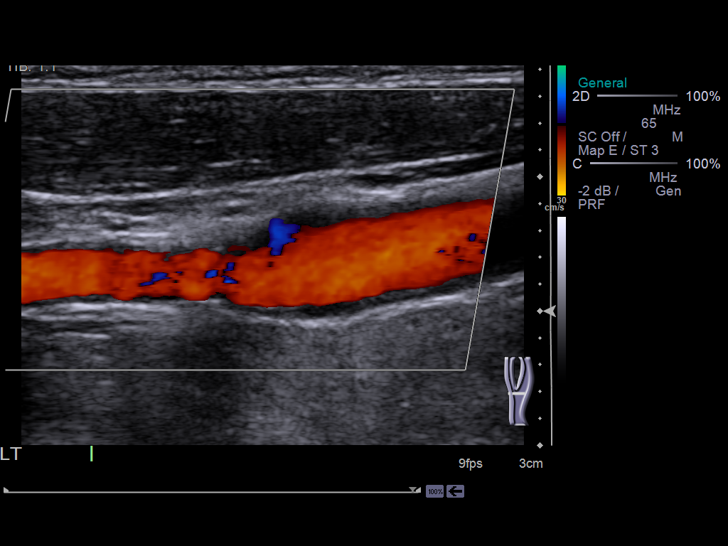
[im 57/69]
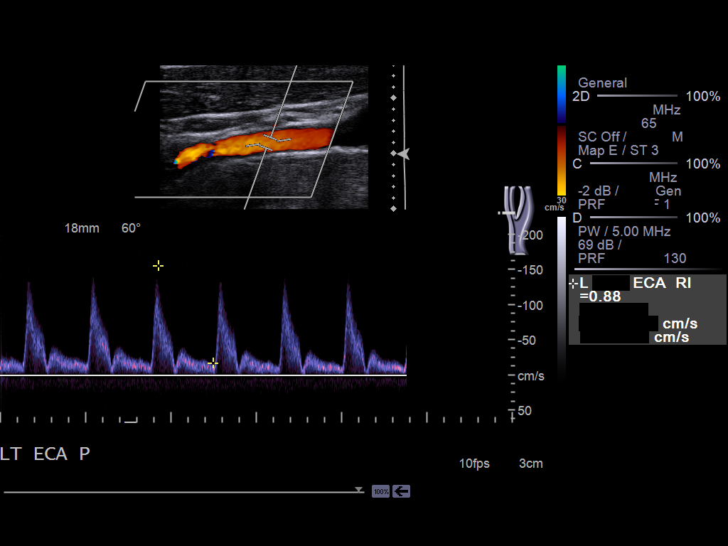
[im 63/69]
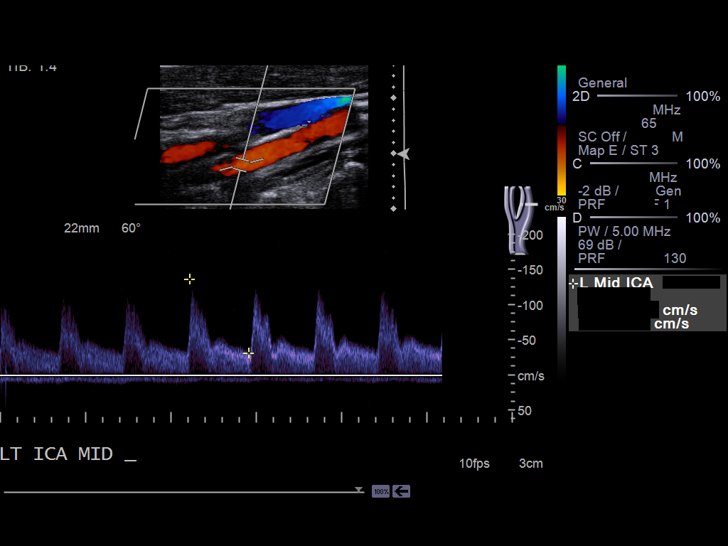
[im 69/69]
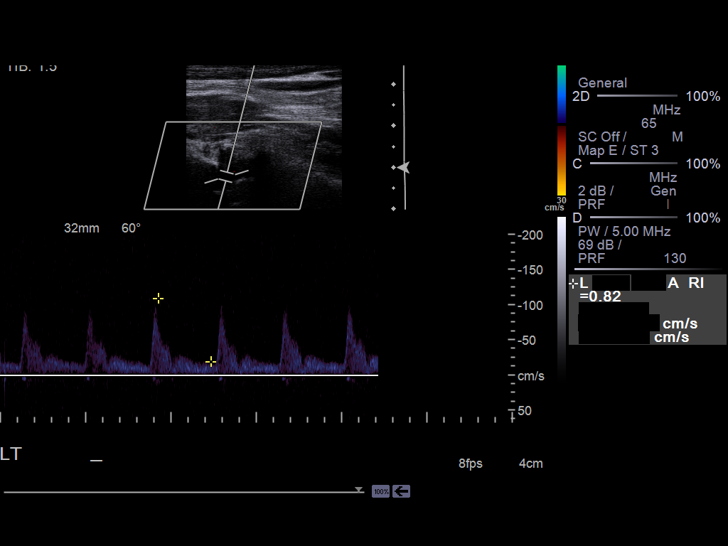

[13 of 24 positions shown; findings below may reference images not displayed]

Criteria:  Quantification of carotid stenosis is based on velocity
parameters that correlate the residual internal carotid diameter
with NASCET-based stenosis levels, using the diameter of the distal
internal carotid lumen as the denominator for stenosis measurement.

The following velocity measurements were obtained:

                 PEAK SYSTOLIC/END DIASTOLIC
RIGHT
ICA:                        197/53cm/sec
CCA:                        111/24cm/sec
SYSTOLIC ICA/CCA RATIO:
DIASTOLIC ICA/CCA RATIO:
ECA:                        171cm/sec

LEFT
ICA:                        138/32cm/sec
CCA:                        146/28cm/sec
SYSTOLIC ICA/CCA RATIO:
DIASTOLIC ICA/CCA RATIO:
ECA:                        157cm/sec
FINDINGS: RIGHT CAROTID ARTERY: Mild diffuse heterogeneous mixed echogenicity
and right carotid bifurcation plaque formation.  In the right mid
ICA, there is velocity elevation measuring 197/52 with associated
luminal narrowing.  This meets criteria for a 50 - 69% stenosis.

RIGHT VERTEBRAL ARTERY:  Antegrade

LEFT CAROTID ARTERY: Mild to moderate heterogeneous left carotid
plaque formation diffusely.  No hemodynamically significant left
ICA stenosis, velocity elevation, or turbulent flow.  Degree of
narrowing less than 50%.

LEFT VERTEBRAL ARTERY:  Antegrade
IMPRESSION: Right greater left carotid atherosclerosis.

Right ICA stenosis estimated at 50 - 69%.

Left ICA narrowing less than 50%.

## 2013-08-31 ENCOUNTER — Encounter: Payer: Self-pay | Admitting: Family

## 2013-09-01 ENCOUNTER — Ambulatory Visit (INDEPENDENT_AMBULATORY_CARE_PROVIDER_SITE_OTHER): Payer: BC Managed Care – PPO | Admitting: Vascular Surgery

## 2013-09-01 ENCOUNTER — Ambulatory Visit (HOSPITAL_COMMUNITY)
Admission: RE | Admit: 2013-09-01 | Discharge: 2013-09-01 | Disposition: A | Discharge status: Home / Self Care | Payer: BC Managed Care – PPO | Source: Ambulatory Visit | Attending: Vascular Surgery | Admitting: Vascular Surgery

## 2013-09-01 ENCOUNTER — Encounter: Payer: Self-pay | Admitting: Vascular Surgery

## 2013-09-01 VITALS — BP 144/80 | HR 62 | Ht 69.0 in | Wt 190.0 lb

## 2013-09-01 DIAGNOSIS — I658 Occlusion and stenosis of other precerebral arteries: Secondary | ICD-10-CM | POA: Insufficient documentation

## 2013-09-01 DIAGNOSIS — I6529 Occlusion and stenosis of unspecified carotid artery: Secondary | ICD-10-CM

## 2013-09-01 NOTE — Progress Notes (Signed)
[facility] [place] HISTORY AND PHYSICAL   History of Present Illness:  Patient is a 58 y.o. year old male who presents for follow-up evaluation for carotid stenosis.  He is on Aspirin for antiplatelet therapy.  His atherosclerotic risk factors remain elevated cholesterol, hypertension.  These are all currently stable and followed by his primary care physician.  He denies any new neurologic events including amaurosis, numbness, or weakness. We have been following him for bilateral moderate carotid stenosis right greater than left.  Past Medical History  Diagnosis Date  . Hypertension   . Carotid artery occlusion     Past Surgical History  Procedure Laterality Date  . Hernia repair      Review of Systems:  Neurologic: as above Cardiac:denies shortness of breath or chest pain Pulmonary: denies cough or wheeze  Social History History  Substance Use Topics  . Smoking status: Former Smoker    Quit date: 09/09/1974  . Smokeless tobacco: Never Used  . Alcohol Use: 1.8 oz/week    3 Cans of beer per week     Comment: occasional    Allergies  No Known Allergies   Current Outpatient Prescriptions  Medication Sig Dispense Refill  . aspirin 81 MG tablet Take 81 mg by mouth daily.      . clopidogrel (PLAVIX) 75 MG tablet       . KRILL OIL PO Take 1 tablet by mouth daily.      . quinapril-hydrochlorothiazide (ACCURETIC) 20-25 MG per tablet Take 1 tablet by mouth daily.        . rosuvastatin (CRESTOR) 20 MG tablet Take 20 mg by mouth daily.      Marland Kitchen DM-Doxylamine-Acetaminophen (VICKS NYQUIL COLD & FLU) 15-6.25-325 MG CAPS Take 1 capsule by mouth daily as needed. For cough/congestion       . doxycycline (VIBRA-TABS) 100 MG tablet Take 100 mg by mouth 2 (two) times daily. Started on 07/08/11. Therapy is for 10 days.       . fluticasone (FLONASE) 50 MCG/ACT nasal spray Place 2 sprays into the nose daily.      Marland Kitchen guaiFENesin-codeine (ROBITUSSIN AC) 100-10 MG/5ML syrup  Take 5 mLs by mouth 3 (three) times daily as needed. For cough         No current facility-administered medications for this visit.    Physical Examination  Filed Vitals:   09/01/13 1309  BP: 144/80  Pulse: 62  Height: 5\' 9"  (1.753 m)  Weight: 190 lb (86.183 kg)  SpO2: 98%    Body mass index is 28.05 kg/(m^2).  General:  Alert and oriented, no acute distress HEENT: Normal Neck: No bruit or JVD Pulmonary: Clear to auscultation bilaterally Cardiac: Regular Rate and Rhythm without murmur Neurologic: Upper and lower extremity motor 5/5 and symmetric  DATA: Patient had a carotid duplex scan today which I reviewed and interpreted. Right internal carotid artery was 40-60% velocities similar 1 year ago.  Left internal carotid artery less than 40%.   ASSESSMENT: Asymptomatic right moderate carotid stenosis no significant change over the past year  PLAN:  Continue duplex surveillance followup one year.  Continue aspirin and control of blood pressure and cholesterol.  Ruta Hinds, MD [facility] [place] Office: [phone] Pager: [phone]

## 2013-09-02 NOTE — Addendum Note (Signed)
Addended by: Dorthula Rue L on: 09/02/2013 03:27 PM   Modules accepted: Orders

## 2014-02-28 DIAGNOSIS — R7309 Other abnormal glucose: Secondary | ICD-10-CM | POA: Insufficient documentation

## 2014-02-28 DIAGNOSIS — M1990 Unspecified osteoarthritis, unspecified site: Secondary | ICD-10-CM | POA: Insufficient documentation

## 2014-02-28 DIAGNOSIS — E8881 Metabolic syndrome: Secondary | ICD-10-CM | POA: Insufficient documentation

## 2014-02-28 DIAGNOSIS — E786 Lipoprotein deficiency: Secondary | ICD-10-CM | POA: Insufficient documentation

## 2014-02-28 DIAGNOSIS — R739 Hyperglycemia, unspecified: Secondary | ICD-10-CM | POA: Insufficient documentation

## 2014-02-28 DIAGNOSIS — E781 Pure hyperglyceridemia: Secondary | ICD-10-CM | POA: Insufficient documentation

## 2014-06-04 DIAGNOSIS — Z8673 Personal history of transient ischemic attack (TIA), and cerebral infarction without residual deficits: Secondary | ICD-10-CM | POA: Insufficient documentation

## 2014-08-28 ENCOUNTER — Telehealth: Payer: Self-pay | Admitting: Family

## 2014-08-28 NOTE — Telephone Encounter (Signed)
Pam asked that I cancel Patrick [name] appointment for follow up. She stated that due to the change in our vascular lab billing, he will no longer come here for his ultrasound. He had a $800.00 bill from his last study here and just simply will not be able to do that again. dpm

## 2014-09-01 ENCOUNTER — Other Ambulatory Visit (HOSPITAL_COMMUNITY): Payer: BC Managed Care – PPO

## 2014-09-01 ENCOUNTER — Ambulatory Visit: Payer: BC Managed Care – PPO | Admitting: Family

## 2014-11-12 DIAGNOSIS — E782 Mixed hyperlipidemia: Secondary | ICD-10-CM | POA: Insufficient documentation

## 2014-11-12 DIAGNOSIS — N183 Chronic kidney disease, stage 3 unspecified: Secondary | ICD-10-CM | POA: Insufficient documentation

## 2015-02-19 DIAGNOSIS — S020XXA Fracture of vault of skull, initial encounter for closed fracture: Secondary | ICD-10-CM | POA: Insufficient documentation

## 2015-02-19 DIAGNOSIS — S0181XA Laceration without foreign body of other part of head, initial encounter: Secondary | ICD-10-CM | POA: Insufficient documentation

## 2015-02-22 DIAGNOSIS — S0011XA Contusion of right eyelid and periocular area, initial encounter: Secondary | ICD-10-CM | POA: Insufficient documentation

## 2015-03-13 DIAGNOSIS — G44319 Acute post-traumatic headache, not intractable: Secondary | ICD-10-CM | POA: Insufficient documentation

## 2015-03-13 DIAGNOSIS — H832X9 Labyrinthine dysfunction, unspecified ear: Secondary | ICD-10-CM | POA: Insufficient documentation

## 2015-03-13 DIAGNOSIS — S069X0A Unspecified intracranial injury without loss of consciousness, initial encounter: Secondary | ICD-10-CM | POA: Insufficient documentation

## 2015-03-13 DIAGNOSIS — J3489 Other specified disorders of nose and nasal sinuses: Secondary | ICD-10-CM | POA: Insufficient documentation

## 2015-03-13 DIAGNOSIS — R0689 Other abnormalities of breathing: Secondary | ICD-10-CM | POA: Insufficient documentation

## 2015-04-10 DIAGNOSIS — M542 Cervicalgia: Secondary | ICD-10-CM | POA: Insufficient documentation

## 2015-04-10 DIAGNOSIS — S069X9A Unspecified intracranial injury with loss of consciousness of unspecified duration, initial encounter: Secondary | ICD-10-CM | POA: Insufficient documentation

## 2015-04-19 ENCOUNTER — Ambulatory Visit (INDEPENDENT_AMBULATORY_CARE_PROVIDER_SITE_OTHER): Payer: BLUE CROSS/BLUE SHIELD | Admitting: *Deleted

## 2015-04-19 DIAGNOSIS — Z23 Encounter for immunization: Secondary | ICD-10-CM | POA: Diagnosis not present

## 2015-06-28 DIAGNOSIS — M47812 Spondylosis without myelopathy or radiculopathy, cervical region: Secondary | ICD-10-CM | POA: Insufficient documentation

## 2015-09-18 DIAGNOSIS — H8190 Unspecified disorder of vestibular function, unspecified ear: Secondary | ICD-10-CM | POA: Insufficient documentation

## 2015-09-18 DIAGNOSIS — R4189 Other symptoms and signs involving cognitive functions and awareness: Secondary | ICD-10-CM | POA: Insufficient documentation

## 2015-09-18 DIAGNOSIS — H832X9 Labyrinthine dysfunction, unspecified ear: Secondary | ICD-10-CM | POA: Insufficient documentation

## 2015-09-18 DIAGNOSIS — R413 Other amnesia: Secondary | ICD-10-CM | POA: Insufficient documentation

## 2016-12-11 DIAGNOSIS — R4781 Slurred speech: Secondary | ICD-10-CM | POA: Insufficient documentation

## 2016-12-11 DIAGNOSIS — R5383 Other fatigue: Secondary | ICD-10-CM | POA: Insufficient documentation

## 2017-05-06 DIAGNOSIS — R7301 Impaired fasting glucose: Secondary | ICD-10-CM | POA: Insufficient documentation

## 2017-05-06 DIAGNOSIS — F0281 Dementia in other diseases classified elsewhere with behavioral disturbance: Secondary | ICD-10-CM | POA: Insufficient documentation

## 2017-05-06 DIAGNOSIS — F02818 Dementia in other diseases classified elsewhere, unspecified severity, with other behavioral disturbance: Secondary | ICD-10-CM | POA: Insufficient documentation

## 2017-08-24 DIAGNOSIS — J209 Acute bronchitis, unspecified: Secondary | ICD-10-CM | POA: Diagnosis not present

## 2017-08-24 DIAGNOSIS — J019 Acute sinusitis, unspecified: Secondary | ICD-10-CM | POA: Diagnosis not present

## 2017-08-24 DIAGNOSIS — J029 Acute pharyngitis, unspecified: Secondary | ICD-10-CM | POA: Diagnosis not present

## 2017-09-07 DIAGNOSIS — E876 Hypokalemia: Secondary | ICD-10-CM | POA: Diagnosis not present

## 2017-09-07 DIAGNOSIS — N183 Chronic kidney disease, stage 3 (moderate): Secondary | ICD-10-CM | POA: Diagnosis not present

## 2017-09-07 DIAGNOSIS — I10 Essential (primary) hypertension: Secondary | ICD-10-CM | POA: Diagnosis not present

## 2017-09-07 DIAGNOSIS — E7800 Pure hypercholesterolemia, unspecified: Secondary | ICD-10-CM | POA: Diagnosis not present

## 2017-09-07 DIAGNOSIS — E782 Mixed hyperlipidemia: Secondary | ICD-10-CM | POA: Diagnosis not present

## 2017-09-10 DIAGNOSIS — E782 Mixed hyperlipidemia: Secondary | ICD-10-CM | POA: Diagnosis not present

## 2017-09-10 DIAGNOSIS — N183 Chronic kidney disease, stage 3 (moderate): Secondary | ICD-10-CM | POA: Diagnosis not present

## 2017-09-10 DIAGNOSIS — I10 Essential (primary) hypertension: Secondary | ICD-10-CM | POA: Diagnosis not present

## 2017-09-10 DIAGNOSIS — R4781 Slurred speech: Secondary | ICD-10-CM | POA: Diagnosis not present

## 2017-09-10 DIAGNOSIS — G44309 Post-traumatic headache, unspecified, not intractable: Secondary | ICD-10-CM | POA: Diagnosis not present

## 2018-03-17 DIAGNOSIS — E782 Mixed hyperlipidemia: Secondary | ICD-10-CM | POA: Diagnosis not present

## 2018-03-17 DIAGNOSIS — I10 Essential (primary) hypertension: Secondary | ICD-10-CM | POA: Diagnosis not present

## 2018-03-17 DIAGNOSIS — E7801 Familial hypercholesterolemia: Secondary | ICD-10-CM | POA: Insufficient documentation

## 2018-03-17 DIAGNOSIS — R7301 Impaired fasting glucose: Secondary | ICD-10-CM | POA: Diagnosis not present

## 2018-03-17 DIAGNOSIS — E876 Hypokalemia: Secondary | ICD-10-CM | POA: Diagnosis not present

## 2018-03-17 DIAGNOSIS — Z23 Encounter for immunization: Secondary | ICD-10-CM | POA: Diagnosis not present

## 2018-03-17 DIAGNOSIS — R5383 Other fatigue: Secondary | ICD-10-CM | POA: Diagnosis not present

## 2018-03-23 DIAGNOSIS — R4781 Slurred speech: Secondary | ICD-10-CM | POA: Diagnosis not present

## 2018-03-23 DIAGNOSIS — E782 Mixed hyperlipidemia: Secondary | ICD-10-CM | POA: Diagnosis not present

## 2018-03-23 DIAGNOSIS — Z1389 Encounter for screening for other disorder: Secondary | ICD-10-CM | POA: Diagnosis not present

## 2018-03-23 DIAGNOSIS — G44309 Post-traumatic headache, unspecified, not intractable: Secondary | ICD-10-CM | POA: Diagnosis not present

## 2018-03-23 DIAGNOSIS — I10 Essential (primary) hypertension: Secondary | ICD-10-CM | POA: Diagnosis not present

## 2018-03-23 DIAGNOSIS — Z6826 Body mass index (BMI) 26.0-26.9, adult: Secondary | ICD-10-CM | POA: Insufficient documentation

## 2018-03-23 DIAGNOSIS — G252 Other specified forms of tremor: Secondary | ICD-10-CM | POA: Diagnosis not present

## 2018-09-02 DIAGNOSIS — I10 Essential (primary) hypertension: Secondary | ICD-10-CM | POA: Diagnosis not present

## 2018-09-02 DIAGNOSIS — E782 Mixed hyperlipidemia: Secondary | ICD-10-CM | POA: Diagnosis not present

## 2018-09-02 DIAGNOSIS — R7301 Impaired fasting glucose: Secondary | ICD-10-CM | POA: Diagnosis not present

## 2018-09-02 DIAGNOSIS — E876 Hypokalemia: Secondary | ICD-10-CM | POA: Diagnosis not present

## 2018-09-06 DIAGNOSIS — Z23 Encounter for immunization: Secondary | ICD-10-CM | POA: Diagnosis not present

## 2018-09-06 DIAGNOSIS — G44329 Chronic post-traumatic headache, not intractable: Secondary | ICD-10-CM | POA: Diagnosis not present

## 2018-09-06 DIAGNOSIS — E782 Mixed hyperlipidemia: Secondary | ICD-10-CM | POA: Diagnosis not present

## 2018-09-06 DIAGNOSIS — I10 Essential (primary) hypertension: Secondary | ICD-10-CM | POA: Diagnosis not present

## 2018-09-06 DIAGNOSIS — R4781 Slurred speech: Secondary | ICD-10-CM | POA: Diagnosis not present

## 2018-09-06 DIAGNOSIS — G44309 Post-traumatic headache, unspecified, not intractable: Secondary | ICD-10-CM | POA: Diagnosis not present

## 2018-12-14 DIAGNOSIS — Z23 Encounter for immunization: Secondary | ICD-10-CM | POA: Diagnosis not present

## 2018-12-14 DIAGNOSIS — T23029A Burn of unspecified degree of unspecified single finger (nail) except thumb, initial encounter: Secondary | ICD-10-CM | POA: Insufficient documentation

## 2018-12-14 DIAGNOSIS — T23242A Burn of second degree of multiple left fingers (nail), including thumb, initial encounter: Secondary | ICD-10-CM | POA: Insufficient documentation

## 2019-03-01 DIAGNOSIS — R7301 Impaired fasting glucose: Secondary | ICD-10-CM | POA: Diagnosis not present

## 2019-03-01 DIAGNOSIS — N183 Chronic kidney disease, stage 3 (moderate): Secondary | ICD-10-CM | POA: Diagnosis not present

## 2019-03-01 DIAGNOSIS — E782 Mixed hyperlipidemia: Secondary | ICD-10-CM | POA: Diagnosis not present

## 2019-03-01 DIAGNOSIS — I10 Essential (primary) hypertension: Secondary | ICD-10-CM | POA: Diagnosis not present

## 2019-03-04 DIAGNOSIS — I10 Essential (primary) hypertension: Secondary | ICD-10-CM | POA: Diagnosis not present

## 2019-03-04 DIAGNOSIS — N183 Chronic kidney disease, stage 3 (moderate): Secondary | ICD-10-CM | POA: Diagnosis not present

## 2019-03-04 DIAGNOSIS — R42 Dizziness and giddiness: Secondary | ICD-10-CM | POA: Diagnosis not present

## 2019-03-04 DIAGNOSIS — G44309 Post-traumatic headache, unspecified, not intractable: Secondary | ICD-10-CM | POA: Diagnosis not present

## 2019-03-04 DIAGNOSIS — R4781 Slurred speech: Secondary | ICD-10-CM | POA: Diagnosis not present

## 2019-03-21 DIAGNOSIS — Z23 Encounter for immunization: Secondary | ICD-10-CM | POA: Diagnosis not present

## 2019-04-06 DIAGNOSIS — I10 Essential (primary) hypertension: Secondary | ICD-10-CM | POA: Diagnosis not present

## 2019-04-06 DIAGNOSIS — E782 Mixed hyperlipidemia: Secondary | ICD-10-CM | POA: Diagnosis not present

## 2019-05-06 DIAGNOSIS — I10 Essential (primary) hypertension: Secondary | ICD-10-CM | POA: Diagnosis not present

## 2019-05-06 DIAGNOSIS — N183 Chronic kidney disease, stage 3 unspecified: Secondary | ICD-10-CM | POA: Diagnosis not present

## 2019-06-06 DIAGNOSIS — I10 Essential (primary) hypertension: Secondary | ICD-10-CM | POA: Diagnosis not present

## 2019-06-06 DIAGNOSIS — E782 Mixed hyperlipidemia: Secondary | ICD-10-CM | POA: Diagnosis not present

## 2019-07-27 DIAGNOSIS — Z20828 Contact with and (suspected) exposure to other viral communicable diseases: Secondary | ICD-10-CM | POA: Insufficient documentation

## 2019-08-05 DIAGNOSIS — E7849 Other hyperlipidemia: Secondary | ICD-10-CM | POA: Diagnosis not present

## 2019-08-05 DIAGNOSIS — I10 Essential (primary) hypertension: Secondary | ICD-10-CM | POA: Diagnosis not present

## 2019-08-29 DIAGNOSIS — R7301 Impaired fasting glucose: Secondary | ICD-10-CM | POA: Diagnosis not present

## 2019-08-29 DIAGNOSIS — N183 Chronic kidney disease, stage 3 unspecified: Secondary | ICD-10-CM | POA: Diagnosis not present

## 2019-08-29 DIAGNOSIS — R739 Hyperglycemia, unspecified: Secondary | ICD-10-CM | POA: Diagnosis not present

## 2019-08-29 DIAGNOSIS — E876 Hypokalemia: Secondary | ICD-10-CM | POA: Diagnosis not present

## 2019-08-29 DIAGNOSIS — I10 Essential (primary) hypertension: Secondary | ICD-10-CM | POA: Diagnosis not present

## 2019-09-01 DIAGNOSIS — R4781 Slurred speech: Secondary | ICD-10-CM | POA: Diagnosis not present

## 2019-09-01 DIAGNOSIS — Z0001 Encounter for general adult medical examination with abnormal findings: Secondary | ICD-10-CM | POA: Insufficient documentation

## 2019-09-01 DIAGNOSIS — G44329 Chronic post-traumatic headache, not intractable: Secondary | ICD-10-CM | POA: Diagnosis not present

## 2019-09-01 DIAGNOSIS — G44309 Post-traumatic headache, unspecified, not intractable: Secondary | ICD-10-CM | POA: Diagnosis not present

## 2019-09-01 DIAGNOSIS — Z1212 Encounter for screening for malignant neoplasm of rectum: Secondary | ICD-10-CM | POA: Diagnosis not present

## 2019-09-01 DIAGNOSIS — I10 Essential (primary) hypertension: Secondary | ICD-10-CM | POA: Diagnosis not present

## 2019-09-02 DIAGNOSIS — I10 Essential (primary) hypertension: Secondary | ICD-10-CM | POA: Diagnosis not present

## 2019-09-02 DIAGNOSIS — E7849 Other hyperlipidemia: Secondary | ICD-10-CM | POA: Diagnosis not present

## 2019-10-05 DIAGNOSIS — E7849 Other hyperlipidemia: Secondary | ICD-10-CM | POA: Diagnosis not present

## 2019-10-05 DIAGNOSIS — I10 Essential (primary) hypertension: Secondary | ICD-10-CM | POA: Diagnosis not present

## 2019-11-04 DIAGNOSIS — R739 Hyperglycemia, unspecified: Secondary | ICD-10-CM | POA: Diagnosis not present

## 2019-11-04 DIAGNOSIS — I10 Essential (primary) hypertension: Secondary | ICD-10-CM | POA: Diagnosis not present

## 2019-12-05 DIAGNOSIS — G44329 Chronic post-traumatic headache, not intractable: Secondary | ICD-10-CM | POA: Diagnosis not present

## 2019-12-05 DIAGNOSIS — E7849 Other hyperlipidemia: Secondary | ICD-10-CM | POA: Diagnosis not present

## 2019-12-05 DIAGNOSIS — I10 Essential (primary) hypertension: Secondary | ICD-10-CM | POA: Diagnosis not present

## 2020-02-14 DIAGNOSIS — R7301 Impaired fasting glucose: Secondary | ICD-10-CM | POA: Diagnosis not present

## 2020-02-14 DIAGNOSIS — N183 Chronic kidney disease, stage 3 unspecified: Secondary | ICD-10-CM | POA: Diagnosis not present

## 2020-02-14 DIAGNOSIS — I10 Essential (primary) hypertension: Secondary | ICD-10-CM | POA: Diagnosis not present

## 2020-02-14 DIAGNOSIS — E782 Mixed hyperlipidemia: Secondary | ICD-10-CM | POA: Diagnosis not present

## 2020-02-20 DIAGNOSIS — Z23 Encounter for immunization: Secondary | ICD-10-CM | POA: Diagnosis not present

## 2020-02-20 DIAGNOSIS — I6522 Occlusion and stenosis of left carotid artery: Secondary | ICD-10-CM | POA: Diagnosis not present

## 2020-02-20 DIAGNOSIS — M545 Low back pain, unspecified: Secondary | ICD-10-CM | POA: Insufficient documentation

## 2020-02-20 DIAGNOSIS — I10 Essential (primary) hypertension: Secondary | ICD-10-CM | POA: Diagnosis not present

## 2020-02-20 DIAGNOSIS — G44329 Chronic post-traumatic headache, not intractable: Secondary | ICD-10-CM | POA: Diagnosis not present

## 2020-02-20 DIAGNOSIS — E782 Mixed hyperlipidemia: Secondary | ICD-10-CM | POA: Diagnosis not present

## 2020-02-26 DIAGNOSIS — I10 Essential (primary) hypertension: Secondary | ICD-10-CM | POA: Diagnosis not present

## 2020-02-26 DIAGNOSIS — R109 Unspecified abdominal pain: Secondary | ICD-10-CM | POA: Diagnosis not present

## 2020-02-26 DIAGNOSIS — R935 Abnormal findings on diagnostic imaging of other abdominal regions, including retroperitoneum: Secondary | ICD-10-CM | POA: Diagnosis not present

## 2020-02-26 DIAGNOSIS — R1031 Right lower quadrant pain: Secondary | ICD-10-CM | POA: Diagnosis not present

## 2020-02-26 DIAGNOSIS — K6389 Other specified diseases of intestine: Secondary | ICD-10-CM | POA: Diagnosis not present

## 2020-02-26 DIAGNOSIS — K219 Gastro-esophageal reflux disease without esophagitis: Secondary | ICD-10-CM | POA: Diagnosis not present

## 2020-02-26 DIAGNOSIS — I447 Left bundle-branch block, unspecified: Secondary | ICD-10-CM | POA: Diagnosis not present

## 2020-02-26 DIAGNOSIS — K449 Diaphragmatic hernia without obstruction or gangrene: Secondary | ICD-10-CM | POA: Diagnosis not present

## 2020-02-26 DIAGNOSIS — E785 Hyperlipidemia, unspecified: Secondary | ICD-10-CM | POA: Diagnosis not present

## 2020-02-26 DIAGNOSIS — Z9181 History of falling: Secondary | ICD-10-CM | POA: Diagnosis not present

## 2020-02-26 DIAGNOSIS — K3189 Other diseases of stomach and duodenum: Secondary | ICD-10-CM | POA: Diagnosis not present

## 2020-02-26 DIAGNOSIS — R1084 Generalized abdominal pain: Secondary | ICD-10-CM | POA: Diagnosis not present

## 2020-02-26 DIAGNOSIS — Z7902 Long term (current) use of antithrombotics/antiplatelets: Secondary | ICD-10-CM | POA: Diagnosis not present

## 2020-02-26 DIAGNOSIS — I700 Atherosclerosis of aorta: Secondary | ICD-10-CM | POA: Diagnosis not present

## 2020-02-26 DIAGNOSIS — Z79899 Other long term (current) drug therapy: Secondary | ICD-10-CM | POA: Diagnosis not present

## 2020-02-26 DIAGNOSIS — R296 Repeated falls: Secondary | ICD-10-CM | POA: Diagnosis not present

## 2020-02-26 DIAGNOSIS — Z743 Need for continuous supervision: Secondary | ICD-10-CM | POA: Diagnosis not present

## 2020-02-27 ENCOUNTER — Telehealth: Payer: Self-pay | Admitting: Gastroenterology

## 2020-02-27 DIAGNOSIS — R1031 Right lower quadrant pain: Secondary | ICD-10-CM | POA: Diagnosis not present

## 2020-02-27 DIAGNOSIS — N390 Urinary tract infection, site not specified: Secondary | ICD-10-CM | POA: Insufficient documentation

## 2020-02-27 DIAGNOSIS — N419 Inflammatory disease of prostate, unspecified: Secondary | ICD-10-CM | POA: Insufficient documentation

## 2020-02-27 NOTE — Telephone Encounter (Signed)
Patrick [name],  I got a call from Dr. Quintin Alto regarding this gentleman. Needs new patient evaluation fairly expeditiously, within next 1-2 weeks (2 weeks at most). RLQ pain, intermittent, CT on file from [facility] that we can see in Epic. Nodular appearance of TI with wall thickening, ?sequela of IBD or small lesions/small bowel neoplasm. He has been started empirically on antibiotics. Dr. Quintin Alto asked that we could see him fairly quickly.  I don't know if a referral has come across or not. Just wanted to give a heads up and get him in with first available.

## 2020-02-27 NOTE — Telephone Encounter (Signed)
Patrick [name] or Patrick [name],  Can you get this patient an asap appointment?

## 2020-02-28 NOTE — Telephone Encounter (Signed)
Pt is coming tomorrow

## 2020-02-29 ENCOUNTER — Encounter: Payer: Self-pay | Admitting: Gastroenterology

## 2020-02-29 ENCOUNTER — Other Ambulatory Visit: Payer: Self-pay

## 2020-02-29 ENCOUNTER — Telehealth: Payer: Self-pay | Admitting: *Deleted

## 2020-02-29 ENCOUNTER — Encounter: Payer: Self-pay | Admitting: *Deleted

## 2020-02-29 ENCOUNTER — Ambulatory Visit: Payer: Medicare Other | Admitting: Gastroenterology

## 2020-02-29 DIAGNOSIS — R1031 Right lower quadrant pain: Secondary | ICD-10-CM | POA: Insufficient documentation

## 2020-02-29 DIAGNOSIS — R1310 Dysphagia, unspecified: Secondary | ICD-10-CM | POA: Diagnosis not present

## 2020-02-29 NOTE — Telephone Encounter (Signed)
PA approved via [facility] website for procedures. Auth# [id number] Dates 03/02/2020-05/31/2020

## 2020-02-29 NOTE — Progress Notes (Signed)
Primary Care Physician:  Manon Hilding, MD Primary Gastroenterologist:  Dr. Abbey Chatters  Chief Complaint  Patient presents with  . Abdominal Pain    went to [facility] R 8/22; pain is some better; rlq; fell 2 weeks ago; had hernia surgery 15 yrs ago    HPI:   Patrick [name] is a 64 y.o. male presenting today at the request of Dr. Quintin Alto, who actually called earlier this year requesting expeditious evaluation due to concern for patient's symptoms. He has been dealing with RLQ Pain, CT on file from [facility] with nodular appearance of TI with wall thickening, ?sequela of IBD or small lesions/small bowel neoplasm. He has been started empirically on antibiotics. Further findings as below. No anemia on outside CBC. Potassium mildly low at 3.1 8/22. Isolated mild elevation of alk phos at 131, lipase 85, non-specific and not indicative of pancreatitis.    Acute onset of pain about 0130 on Sunday morning. Broke out in a sweat, severe pain RLQ, pain eased up after about 20 minutes. Could still feel it but had eased up. Recurrent pain again about 0730. After urinating, started to pass out again. No diarrhea. When stretching out, the pain eases up. Currently being treated for UTI, prostatitis. No constipation. No changes in bowel habits. No rectal bleeding. Sometimes has to sit a little longer on toilet to have a BM. Rare laxative. For most part doesn't need to use anything. Has been working outside over the summer and lost around 8-9 lbs. Pain now brought on by movement. Didn't get steroids or antibiotics till late Monday evening.   [place] a few weeks ago backwards on a trailer and hit rear hard. When falling, he had burning in lower abdomen that wrapped around back. 15 years ago had bilateral inguinal hernia   No prior colonoscoy or EGD.   Solid food dysphagia 3-4 years.     Past Medical History:  Diagnosis Date  . 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 Hypertension   .  Hypertriglyceridemia     Past Surgical History:  Procedure Laterality Date  . HERNIA REPAIR     bilateral    Current Outpatient Medications  Medication Sig Dispense Refill  . ciprofloxacin (CIPRO) 500 MG tablet Take 500 mg by mouth 2 (two) times daily.    . clopidogrel (PLAVIX) 75 MG tablet Take 75 mg by mouth daily.     . DULoxetine (CYMBALTA) 60 MG capsule Take 1 capsule by mouth daily.    Marland Kitchen HYDROcodone-acetaminophen (NORCO/VICODIN) 5-325 MG tablet Take 1 tablet by mouth every 4 (four) hours as needed.    Marland Kitchen omeprazole (PRILOSEC) 40 MG capsule Take 40 mg by mouth daily.    . potassium chloride SA (KLOR-CON) 20 MEQ tablet Take 20 mEq by mouth daily.    . predniSONE (DELTASONE) 20 MG tablet Take 1 tablet by mouth daily. x7 days    . quinapril-hydrochlorothiazide (ACCURETIC) 20-25 MG per tablet Take 1 tablet by mouth daily.      . rosuvastatin (CRESTOR) 20 MG tablet Take 20 mg by mouth daily.     No current facility-administered medications for this visit.    Allergies as of 02/29/2020  . (No Known Allergies)    Family History  Problem Relation Age of Onset  . Cancer Mother 64  . Colon cancer Mother   . Heart attack Father     Social History   Socioeconomic History  . Marital status: Married  Spouse name: Not on file  . Number of children: Not on file  . Years of education: Not on file  . Highest education level: Not on file  Occupational History  . Not on file  Tobacco Use  . Smoking status: Former Smoker    Quit date: 09/09/1974    Years since quitting: 45.5  . Smokeless tobacco: Never Used  Substance and Sexual Activity  . Alcohol use: Yes    Comment: occasional beer  . Drug use: No  . Sexual activity: Yes    Birth control/protection: None  Other Topics Concern  . Not on file  Social History Narrative  . Not on file   Social Determinants of Health   Financial Resource Strain:   . Difficulty of Paying Living Expenses: Not on file  Food Insecurity:   .  Worried About Charity fundraiser in the Last Year: Not on file  . Ran Out of Food in the Last Year: Not on file  Transportation Needs:   . Lack of Transportation (Medical): Not on file  . Lack of Transportation (Non-Medical): Not on file  Physical Activity:   . Days of Exercise per Week: Not on file  . Minutes of Exercise per Session: Not on file  Stress:   . Feeling of Stress : Not on file  Social Connections:   . Frequency of Communication with Friends and Family: Not on file  . Frequency of Social Gatherings with Friends and Family: Not on file  . Attends Religious Services: Not on file  . Active Member of Clubs or Organizations: Not on file  . Attends Archivist Meetings: Not on file  . Marital Status: Not on file  Intimate Partner Violence:   . Fear of Current or Ex-Partner: Not on file  . Emotionally Abused: Not on file  . Physically Abused: Not on file  . Sexually Abused: Not on file    Review of Systems: Gen: see HPI  CV: Denies chest pain, heart palpitations, peripheral edema, syncope.  Resp: Denies shortness of breath at rest or with exertion. Denies wheezing or cough.  GI: see HPI GU : Denies urinary burning, urinary frequency, urinary hesitancy MS: Denies joint pain, muscle weakness, cramps, or limitation of movement.  Derm: Denies rash, itching, dry skin Psych: Denies depression, anxiety, memory loss, and confusion Heme: Denies bruising, bleeding, and enlarged lymph nodes.  Physical Exam: BP (!) 144/79   Pulse 73   Temp 97.7 F (36.5 C) (Temporal)   Ht 5' 9"  (1.753 m)   Wt 172 lb 12.8 oz (78.4 kg)   BMI 25.52 kg/m  General:   Alert and oriented. Pleasant and cooperative. Well-nourished and well-developed.  Head:  Normocephalic and atraumatic. Eyes:  Without icterus, sclera clear and conjunctiva pink.  Ears:  Normal auditory acuity. Mouth:  No deformity or lesions, oral mucosa pink.  Lungs:  Clear to auscultation bilaterally. No wheezes, rales,  or rhonchi. No distress.  Heart:  S1, S2 present without murmurs appreciated.  Abdomen:  +BS, soft, mild TTP RLQ and non-distended. No HSM noted. No guarding or rebound. No masses appreciated.  Rectal:  Deferred  Msk:  Symmetrical without gross deformities. Normal posture. Extremities:  Without edema. Neurologic:  Alert and  oriented x4;  grossly normal neurologically. Skin:  Intact without significant lesions or rashes. Psych:  Alert and cooperative. Normal mood and affect.  CT abd/pelvis with IV contrast at [facility] 02/26/20 1. There is a nodular appearance to the terminal ileum  with  eccentric wall thickening suggested on image 53 of series 5. Limited  distension limiting assessment on today's exam. Findings could be  seen as sequela of inflammatory bowel disease or small lesions/small  bowel neoplasm in the terminal ileum. Consider follow-up CT  enterography for further assessment with GI consult and colonoscopy  with terminal ileal evaluation on follow-up as warranted or  comparison with recent colonoscopy results if available.  2. There are other areas in the distal ileum that raise the question  of similar process. Correlation with any repeated episodes of RIGHT  lower quadrant pain may be helpful. No perienteric stranding, signs  of fistula or abscess.  3. Sclerosis of the LEFT femoral head may be indicative of mild AVN  but is not associated with the articular surface. Seen along the  anterior femoral head away from the articular surface. Correlate  with any LEFT hip pain  4. Small hiatal hernia.  5. Aortic atherosclerosis.   Aortic Atherosclerosis (ICD10-I70.0).    Electronically Signed  By: Zetta Bills M.D.  On: 02/26/2020 11:56  CLINICAL DATA: RIGHT lower quadrant pain, burning with urination.  Severe lower abdominal pain and sweating   EXAM:  CT ABDOMEN AND PELVIS WITH CONTRAST   TECHNIQUE:  Multidetector CT imaging of the abdomen and pelvis was  performed  using the standardprotocol following bolus administration of  intravenous contrast.   CONTRAST: 80 mL Omnipaque 350   COMPARISON: None   FINDINGS:  Lower chest: Basilar atelectasis, no effusion. No consolidation.   Hepatobiliary: No focal, suspicious hepatic lesion. The portal vein  is patent.   No pericholecystic stranding. No biliary duct dilation.   Pancreas: Pancreas is normal that ductal dilation, inflammation or  focal lesion.   Spleen: Spleen normal in size and contour.   Adrenals/Urinary Tract: Adrenal glands are normal.   Kidneys enhance symmetrically. No hydronephrosis. Urinary bladder is  normal.   Stomach/Bowel: Small hiatal hernia. Under distension of the stomach  limiting assessment. The no acute gastrointestinal process related  to stomach or proximal small bowel. Mildly patulous appearance of  the distal ileum with some stool like material, does not appear  dilated to the extent that would be expected for an obstruction.   The appendix is normal. No perienteric stranding. There is however a  nodular appearance to the terminal ileum with eccentric wall  thickening suggested on image 53 of series 5. Limited distension  limiting assessment on today's exam. No mesenteric adenopathy. Hazy  central mesentery with small lymph nodes is a nonspecific finding.  Colon is largely stool filled without pericolonic stranding.   Vascular/Lymphatic: No aneurysmal dilation of the abdominal aorta.  No adenopathy in the abdomen. Distal thoracic aorta with  calcification. Scattered aortic calcifications in the abdominal  aorta.   Reproductive: Prostate with mild heterogeneity and enlargement and  some calcifications, a nonspecific finding. No pelvic  lymphadenopathy.   Other: No free air. No free fluid.   Musculoskeletal: Spinal degenerative changes.No acute or  destructive bone finding. Sclerosis of the LEFT femoral head may be  indicative of mild AVN  but is not associated with the articular  surface. Seen along the anterior femoral head away from the  articular surface.   ASSESSMENT: ALONZO OWCZARZAK is a 64 y.o. male presenting today as urgent evaluation requested by Dr. Quintin Alto due to with RLQ pain and CT on file from outside facility with nodular appearance of TI with wall thickening. Empiric antibiotics started. Clinically improved from initial onset of pain  now.   Incidentally also noted solid food dysphagia for past several years. No prior colonoscopy or EGD.   Needs diagnostic colonoscopy in near future. He is to call if any recurrent pain, diarrhea, N/V, etc. EGD with dilation to be performed at time of colonoscopy.    PLAN:  Proceed with colonoscopy/EGD/dilation by Dr. Abbey Chatters  in near future: the risks, benefits, and alternatives have been discussed with the patient in detail. The patient states understanding and desires to proceed.   Call if any clinical changes: if worsening pain, needs repeat imaging  Follow-up in clinic thereafter  Annitta Needs, PhD, ANP-BC [facility]

## 2020-02-29 NOTE — Patient Instructions (Addendum)
We are arranging a colonoscopy, upper endoscopy, and dilation in the near future.  Please call if diarrhea, fever, worsening pain.   Further recommendations to follow!  It was a pleasure to see you today. I want to create trusting relationships with patients to provide genuine, compassionate, and quality care. I value your feedback. If you receive a survey regarding your visit,  I greatly appreciate you taking time to fill this out.   Annitta Needs, PhD, ANP-BC [facility]

## 2020-02-29 NOTE — Patient Instructions (Signed)
Patrick [name]  02/29/2020     @PREFPERIOPPHARMACY @   Your procedure is scheduled on  03/02/2020.  Report to [facility] at  1030  A.M.  Call this number if you have problems the morning of surgery:  [phone]   Remember:  Follow the diet and prep instructions given to you by the office.                       Take these medicines the morning of surgery with A SIP OF WATER  Cymbalta, hydrocodone(if needed), omeprazole, zofran(if needed), topamax.    Do not wear jewelry, make-up or nail polish.  Do not wear lotions, powders, or perfumes. Please wear deodorant and brush your teeth.  Do not shave 48 hours prior to surgery.  Men may shave face and neck.  Do not bring valuables to the hospital.  [facility] is not responsible for any belongings or valuables.  Contacts, dentures or bridgework may not be worn into surgery.  Leave your suitcase in the car.  After surgery it may be brought to your room.  For patients admitted to the hospital, discharge time will be determined by your treatment team.  Patients discharged the day of surgery will not be allowed to drive home.   Name and phone number of your driver:   family Special instructions:   DO NOT smoke the morning of your procedure.  Please read over the following fact sheets that you were given. Anesthesia Post-op Instructions and Care and Recovery After Surgery       Upper Endoscopy, Adult, Care After This sheet gives you information about how to care for yourself after your procedure. Your health care provider may also give you more specific instructions. If you have problems or questions, contact your health care provider. What can I expect after the procedure? After the procedure, it is common to have:  A sore throat.  Mild stomach pain or discomfort.  Bloating.  Nausea. Follow these instructions at home:   Follow instructions from your health care provider about what to eat or drink after your  procedure.  Return to your normal activities as told by your health care provider. Ask your health care provider what activities are safe for you.  Take over-the-counter and prescription medicines only as told by your health care provider.  Do not drive for 24 hours if you were given a sedative during your procedure.  Keep all follow-up visits as told by your health care provider. This is important. Contact a health care provider if you have:  A sore throat that lasts longer than one day.  Trouble swallowing. Get help right away if:  You vomit blood or your vomit looks like coffee grounds.  You have: ? A fever. ? Bloody, black, or tarry stools. ? A severe sore throat or you cannot swallow. ? Difficulty breathing. ? Severe pain in your chest or abdomen. Summary  After the procedure, it is common to have a sore throat, mild stomach discomfort, bloating, and nausea.  Do not drive for 24 hours if you were given a sedative during the procedure.  Follow instructions from your health care provider about what to eat or drink after your procedure.  Return to your normal activities as told by your health care provider. This information is not intended to replace advice given to you by your health care provider. Make sure you discuss any questions  you have with your health care provider. Document Revised: 12/15/2017 Document Reviewed: 11/23/2017 Elsevier Patient Education  [place].  Esophageal Dilatation Esophageal dilatation, also called esophageal dilation, is a procedure to widen or open (dilate) a blocked or narrowed part of the esophagus. The esophagus is the part of the body that moves food and liquid from the mouth to the stomach. You may need this procedure if:  You have a buildup of scar tissue in your esophagus that makes it difficult, painful, or impossible to swallow. This can be caused by gastroesophageal reflux disease (GERD).  You have cancer of the  esophagus.  There is a problem with how food moves through your esophagus. In some cases, you may need this procedure repeated at a later time to dilate the esophagus gradually. Tell a health care provider about:  Any allergies you have.  All medicines you are taking, including vitamins, herbs, eye drops, creams, and over-the-counter medicines.  Any problems you or family members have had with anesthetic medicines.  Any blood disorders you have.  Any surgeries you have had.  Any medical conditions you have.  Any antibiotic medicines you are required to take before dental procedures.  Whether you are pregnant or may be pregnant. What are the risks? Generally, this is a safe procedure. However, problems may occur, including:  Bleeding due to a tear in the lining of the esophagus.  A hole (perforation) in the esophagus. What happens before the procedure?  Follow instructions from your health care provider about eating or drinking restrictions.  Ask your health care provider about changing or stopping your regular medicines. This is especially important if you are taking diabetes medicines or blood thinners.  Plan to have someone take you home from the hospital or clinic.  Plan to have a responsible adult care for you for at least 24 hours after you leave the hospital or clinic. This is important. What happens during the procedure?  You may be given a medicine to help you relax (sedative).  A numbing medicine may be sprayed into the back of your throat, or you may gargle the medicine.  Your health care provider may perform the dilatation using various surgical instruments, such as: ? Simple dilators. This instrument is carefully placed in the esophagus to stretch it. ? Guided wire bougies. This involves using an endoscope to insert a wire into the esophagus. A dilator is passed over this wire to enlarge the esophagus. Then the wire is removed. ? Balloon dilators. An endoscope  with a small balloon at the end is inserted into the esophagus. The balloon is inflated to stretch the esophagus and open it up. The procedure may vary among health care providers and hospitals. What happens after the procedure?  Your blood pressure, heart rate, breathing rate, and blood oxygen level will be monitored until the medicines you were given have worn off.  Your throat may feel slightly sore and numb. This will improve slowly over time.  You will not be allowed to eat or drink until your throat is no longer numb.  When you are able to drink, urinate, and sit on the edge of the bed without nausea or dizziness, you may be able to return home. Follow these instructions at home:  Take over-the-counter and prescription medicines only as told by your health care provider.  Do not drive for 24 hours if you were given a sedative during your procedure.  You should have a responsible adult with you for  24 hours after the procedure.  Follow instructions from your health care provider about any eating or drinking restrictions.  Do not use any products that contain nicotine or tobacco, such as cigarettes and e-cigarettes. If you need help quitting, ask your health care provider.  Keep all follow-up visits as told by your health care provider. This is important. Get help right away if you:  Have a fever.  Have chest pain.  Have pain that is not relieved by medication.  Have trouble breathing.  Have trouble swallowing.  Vomit blood. Summary  Esophageal dilatation, also called esophageal dilation, is a procedure to widen or open (dilate) a blocked or narrowed part of the esophagus.  Plan to have someone take you home from the hospital or clinic.  For this procedure, a numbing medicine may be sprayed into the back of your throat, or you may gargle the medicine.  Do not drive for 24 hours if you were given a sedative during your procedure. This information is not intended to  replace advice given to you by your health care provider. Make sure you discuss any questions you have with your health care provider. Document Revised: 04/20/2019 Document Reviewed: 04/28/2017 Elsevier Patient Education  [place].  Colonoscopy, Adult, Care After This sheet gives you information about how to care for yourself after your procedure. Your health care provider may also give you more specific instructions. If you have problems or questions, contact your health care provider. What can I expect after the procedure? After the procedure, it is common to have:  A small amount of blood in your stool for 24 hours after the procedure.  Some gas.  Mild cramping or bloating of your abdomen. Follow these instructions at home: Eating and drinking   Drink enough fluid to keep your urine pale yellow.  Follow instructions from your health care provider about eating or drinking restrictions.  Resume your normal diet as instructed by your health care provider. Avoid heavy or fried foods that are hard to digest. Activity  Rest as told by your health care provider.  Avoid sitting for a long time without moving. Get up to take short walks every 1-2 hours. This is important to improve blood flow and breathing. Ask for help if you feel weak or unsteady.  Return to your normal activities as told by your health care provider. Ask your health care provider what activities are safe for you. Managing cramping and bloating   Try walking around when you have cramps or feel bloated.  Apply heat to your abdomen as told by your health care provider. Use the heat source that your health care provider recommends, such as a moist heat pack or a heating pad. ? Place a towel between your skin and the heat source. ? Leave the heat on for 20-30 minutes. ? Remove the heat if your skin turns bright red. This is especially important if you are unable to feel pain, heat, or cold. You may have a  greater risk of getting burned. General instructions  For the first 24 hours after the procedure: ? Do not drive or use machinery. ? Do not sign important documents. ? Do not drink alcohol. ? Do your regular daily activities at a slower pace than normal. ? Eat soft foods that are easy to digest.  Take over-the-counter and prescription medicines only as told by your health care provider.  Keep all follow-up visits as told by your health care provider. This is important. Contact  a health care provider if:  You have blood in your stool 2-3 days after the procedure. Get help right away if you have:  More than a small spotting of blood in your stool.  Large blood clots in your stool.  Swelling of your abdomen.  Nausea or vomiting.  A fever.  Increasing pain in your abdomen that is not relieved with medicine. Summary  After the procedure, it is common to have a small amount of blood in your stool. You may also have mild cramping and bloating of your abdomen.  For the first 24 hours after the procedure, do not drive or use machinery, sign important documents, or drink alcohol.  Get help right away if you have a lot of blood in your stool, nausea or vomiting, a fever, or increased pain in your abdomen. This information is not intended to replace advice given to you by your health care provider. Make sure you discuss any questions you have with your health care provider. Document Revised: 01/17/2019 Document Reviewed: 01/17/2019 Elsevier Patient Education  [place] After These instructions provide you with information about caring for yourself after your procedure. Your health care provider may also give you more specific instructions. Your treatment has been planned according to current medical practices, but problems sometimes occur. Call your health care provider if you have any problems or questions after your procedure. What can I expect  after the procedure? After your procedure, you may:  Feel sleepy for several hours.  Feel clumsy and have poor balance for several hours.  Feel forgetful about what happened after the procedure.  Have poor judgment for several hours.  Feel nauseous or vomit.  Have a sore throat if you had a breathing tube during the procedure. Follow these instructions at home: For at least 24 hours after the procedure:      Have a responsible adult stay with you. It is important to have someone help care for you until you are awake and alert.  Rest as needed.  Do not: ? Participate in activities in which you could fall or become injured. ? Drive. ? Use heavy machinery. ? Drink alcohol. ? Take sleeping pills or medicines that cause drowsiness. ? Make important decisions or sign legal documents. ? Take care of children on your own. Eating and drinking  Follow the diet that is recommended by your health care provider.  If you vomit, drink water, juice, or soup when you can drink without vomiting.  Make sure you have little or no nausea before eating solid foods. General instructions  Take over-the-counter and prescription medicines only as told by your health care provider.  If you have sleep apnea, surgery and certain medicines can increase your risk for breathing problems. Follow instructions from your health care provider about wearing your sleep device: ? Anytime you are sleeping, including during daytime naps. ? While taking prescription pain medicines, sleeping medicines, or medicines that make you drowsy.  If you smoke, do not smoke without supervision.  Keep all follow-up visits as told by your health care provider. This is important. Contact a health care provider if:  You keep feeling nauseous or you keep vomiting.  You feel light-headed.  You develop a rash.  You have a fever. Get help right away if:  You have trouble breathing. Summary  For several hours after  your procedure, you may feel sleepy and have poor judgment.  Have a responsible adult stay with you for  at least 24 hours or until you are awake and alert. This information is not intended to replace advice given to you by your health care provider. Make sure you discuss any questions you have with your health care provider. Document Revised: 09/21/2017 Document Reviewed: 10/14/2015 Elsevier Patient Education  [place].

## 2020-03-01 ENCOUNTER — Other Ambulatory Visit: Payer: Self-pay

## 2020-03-01 ENCOUNTER — Ambulatory Visit: Payer: Self-pay | Admitting: Gastroenterology

## 2020-03-01 ENCOUNTER — Other Ambulatory Visit (HOSPITAL_COMMUNITY)
Admission: RE | Admit: 2020-03-01 | Discharge: 2020-03-01 | Disposition: A | Discharge status: Home / Self Care | Payer: Medicare Other | Source: Ambulatory Visit | Attending: Internal Medicine | Admitting: Internal Medicine

## 2020-03-01 ENCOUNTER — Encounter (HOSPITAL_COMMUNITY)
Admission: RE | Admit: 2020-03-01 | Discharge: 2020-03-01 | Disposition: A | Discharge status: Home / Self Care | Payer: Medicare Other | Source: Ambulatory Visit | Attending: Internal Medicine | Admitting: Internal Medicine

## 2020-03-01 DIAGNOSIS — Z20822 Contact with and (suspected) exposure to covid-19: Secondary | ICD-10-CM | POA: Diagnosis not present

## 2020-03-01 DIAGNOSIS — Z01812 Encounter for preprocedural laboratory examination: Secondary | ICD-10-CM | POA: Diagnosis not present

## 2020-03-01 LAB — SARS CORONAVIRUS 2 (TAT 6-24 HRS): SARS Coronavirus 2: NEGATIVE

## 2020-03-02 ENCOUNTER — Ambulatory Visit (HOSPITAL_COMMUNITY): Payer: Medicare Other | Admitting: Anesthesiology

## 2020-03-02 ENCOUNTER — Encounter (HOSPITAL_COMMUNITY): Payer: Self-pay | Admitting: Anesthesiology

## 2020-03-02 ENCOUNTER — Encounter (HOSPITAL_COMMUNITY)
Admission: RE | Disposition: A | Discharge status: Home / Self Care | Payer: Self-pay | Source: Home / Self Care | Attending: Internal Medicine

## 2020-03-02 ENCOUNTER — Ambulatory Visit (HOSPITAL_COMMUNITY)
Admission: RE | Admit: 2020-03-02 | Discharge: 2020-03-02 | Disposition: A | Discharge status: Home / Self Care | Payer: Medicare Other | Attending: Internal Medicine | Admitting: Internal Medicine

## 2020-03-02 ENCOUNTER — Other Ambulatory Visit: Payer: Self-pay

## 2020-03-02 DIAGNOSIS — Z87891 Personal history of nicotine dependence: Secondary | ICD-10-CM | POA: Diagnosis not present

## 2020-03-02 DIAGNOSIS — K319 Disease of stomach and duodenum, unspecified: Secondary | ICD-10-CM | POA: Diagnosis not present

## 2020-03-02 DIAGNOSIS — D123 Benign neoplasm of transverse colon: Secondary | ICD-10-CM | POA: Diagnosis not present

## 2020-03-02 DIAGNOSIS — Z809 Family history of malignant neoplasm, unspecified: Secondary | ICD-10-CM | POA: Insufficient documentation

## 2020-03-02 DIAGNOSIS — K635 Polyp of colon: Secondary | ICD-10-CM | POA: Diagnosis not present

## 2020-03-02 DIAGNOSIS — Z8249 Family history of ischemic heart disease and other diseases of the circulatory system: Secondary | ICD-10-CM | POA: Insufficient documentation

## 2020-03-02 DIAGNOSIS — K6389 Other specified diseases of intestine: Secondary | ICD-10-CM | POA: Diagnosis not present

## 2020-03-02 DIAGNOSIS — R1310 Dysphagia, unspecified: Secondary | ICD-10-CM | POA: Diagnosis not present

## 2020-03-02 DIAGNOSIS — Z79899 Other long term (current) drug therapy: Secondary | ICD-10-CM | POA: Insufficient documentation

## 2020-03-02 DIAGNOSIS — K2970 Gastritis, unspecified, without bleeding: Secondary | ICD-10-CM | POA: Insufficient documentation

## 2020-03-02 DIAGNOSIS — K3189 Other diseases of stomach and duodenum: Secondary | ICD-10-CM | POA: Diagnosis not present

## 2020-03-02 DIAGNOSIS — Z800 Family history of malignant neoplasm of digestive organs: Secondary | ICD-10-CM | POA: Diagnosis not present

## 2020-03-02 DIAGNOSIS — K222 Esophageal obstruction: Secondary | ICD-10-CM | POA: Insufficient documentation

## 2020-03-02 DIAGNOSIS — K5730 Diverticulosis of large intestine without perforation or abscess without bleeding: Secondary | ICD-10-CM | POA: Diagnosis not present

## 2020-03-02 DIAGNOSIS — I10 Essential (primary) hypertension: Secondary | ICD-10-CM | POA: Insufficient documentation

## 2020-03-02 DIAGNOSIS — Z8673 Personal history of transient ischemic attack (TIA), and cerebral infarction without residual deficits: Secondary | ICD-10-CM | POA: Insufficient documentation

## 2020-03-02 DIAGNOSIS — R933 Abnormal findings on diagnostic imaging of other parts of digestive tract: Secondary | ICD-10-CM | POA: Diagnosis not present

## 2020-03-02 DIAGNOSIS — K648 Other hemorrhoids: Secondary | ICD-10-CM | POA: Diagnosis not present

## 2020-03-02 DIAGNOSIS — I6522 Occlusion and stenosis of left carotid artery: Secondary | ICD-10-CM | POA: Diagnosis not present

## 2020-03-02 DIAGNOSIS — Z7952 Long term (current) use of systemic steroids: Secondary | ICD-10-CM | POA: Insufficient documentation

## 2020-03-02 DIAGNOSIS — E781 Pure hyperglyceridemia: Secondary | ICD-10-CM | POA: Diagnosis not present

## 2020-03-02 HISTORY — PX: COLONOSCOPY WITH PROPOFOL: SHX5780

## 2020-03-02 HISTORY — DX: Cerebral infarction, unspecified: I63.9

## 2020-03-02 HISTORY — PX: ESOPHAGOGASTRODUODENOSCOPY (EGD) WITH PROPOFOL: SHX5813

## 2020-03-02 HISTORY — PX: BIOPSY: SHX5522

## 2020-03-02 SURGERY — COLONOSCOPY WITH PROPOFOL
Anesthesia: General

## 2020-03-02 MED ORDER — OMEPRAZOLE 40 MG PO CPDR: 40.0000 mg | DELAYED_RELEASE_CAPSULE | Freq: Two times a day (BID) | ORAL | 5 refills | Status: DC

## 2020-03-02 MED ORDER — PROPOFOL 10 MG/ML IV BOLUS
INTRAVENOUS | Status: DC | PRN
  Administered 2020-03-02: 50 mg via INTRAVENOUS
  Administered 2020-03-02 (×2): 30 mg via INTRAVENOUS

## 2020-03-02 MED ORDER — PROPOFOL 500 MG/50ML IV EMUL
INTRAVENOUS | Status: DC | PRN
  Administered 2020-03-02: 150 ug/kg/min via INTRAVENOUS

## 2020-03-02 MED ORDER — LACTATED RINGERS IV SOLN: INTRAVENOUS | Status: DC

## 2020-03-02 MED ORDER — STERILE WATER FOR IRRIGATION IR SOLN
Status: DC | PRN
  Administered 2020-03-02: 1.5 mL

## 2020-03-02 MED ORDER — LIDOCAINE VISCOUS HCL 2 % MT SOLN
15.0000 mL | Freq: Once | OROMUCOSAL | Status: AC
  Administered 2020-03-02: 15 mL via OROMUCOSAL

## 2020-03-02 MED ORDER — GLYCOPYRROLATE 0.2 MG/ML IJ SOLN
0.2000 mg | Freq: Once | INTRAMUSCULAR | Status: AC
  Administered 2020-03-02: 0.2 mg via INTRAVENOUS

## 2020-03-02 MED ORDER — LIDOCAINE VISCOUS HCL 2 % MT SOLN
OROMUCOSAL | Status: AC
  Filled 2020-03-02: qty 15

## 2020-03-02 MED ORDER — GLYCOPYRROLATE 0.2 MG/ML IJ SOLN
INTRAMUSCULAR | Status: AC
  Filled 2020-03-02: qty 1

## 2020-03-02 NOTE — Op Note (Addendum)
[facility] Patient Name: Patrick [name] Procedure Date: 03/02/2020 11:10 AM MRN: [id number] Date of Birth: 1956/02/01 Attending MD: Elon Alas. Abbey Chatters DO CSN: [id number] Age: 64 Admit Type: Outpatient Procedure:                Upper GI endoscopy Indications:              Dysphagia Providers:                Elon Alas. Abbey Chatters, DO, Janeece Riggers, RN, Lambert Mody, Casimer Bilis, Technician,                            Randa Spike, Technician Referring MD:              Medicines:                See the Anesthesia note for documentation of the                            administered medications Complications:            No immediate complications. Estimated Blood Loss:     Estimated blood loss was minimal. Procedure:                Pre-Anesthesia Assessment:                           - The anesthesia plan was to use monitored                            anesthesia care (MAC).                           After obtaining informed consent, the endoscope was                            passed under direct vision. Throughout the                            procedure, the patient's blood pressure, pulse, and                            oxygen saturations were monitored continuously. The                            GIF-H190 ([id number]) scope was introduced through the                            mouth, and advanced to the second part of duodenum.                            The upper GI endoscopy was accomplished without                            difficulty. The patient tolerated the procedure  well. Scope In: 11:49:30 AM Scope Out: 11:55:09 AM Total Procedure Duration: 0 hours 5 minutes 39 seconds  Findings:      A moderate Schatzki ring was found in the lower third of the esophagus.       Obliterated with biopsy forceps      Diffuse mild inflammation characterized by erythema was found in the       entire examined stomach.  Biopsies were taken with a cold forceps for       Helicobacter pylori testing.      The duodenal bulb, first portion of the duodenum and second portion of       the duodenum were normal. Impression:               - Moderate Schatzki ring.                           - Gastritis. Biopsied.                           - Normal duodenal bulb, first portion of the                            duodenum and second portion of the duodenum. Moderate Sedation:      Per Anesthesia Care Recommendation:           - Patient has a contact number available for                            emergencies. The signs and symptoms of potential                            delayed complications were discussed with the                            patient. Return to normal activities tomorrow.                            Written discharge instructions were provided to the                            patient.                           - Resume previous diet.                           - Continue present medications.                           - Await pathology results.                           - Repeat upper endoscopy PRN for retreatment.                           - Use a proton pump inhibitor PO BID for 8 weeks  then decrease back down to once daily.                           - Return to GI clinic with Roseanne Kaufman in 8 weeks. Procedure Code(s):        --- Professional ---                           [phone], Esophagogastroduodenoscopy, flexible,                            transoral; with biopsy, single or multiple Diagnosis Code(s):        --- Professional ---                           K22.2, Esophageal obstruction                           K29.70, Gastritis, unspecified, without bleeding                           R13.10, Dysphagia, unspecified CPT copyright 2019 American Medical Association. All rights reserved. The codes documented in this report are preliminary and upon coder review may  be revised  to meet current compliance requirements. Elon Alas. Abbey Chatters, [place] Abbey Chatters, DO 03/02/2020 11:58:37 AM This report has been signed electronically. Number of Addenda: 0

## 2020-03-02 NOTE — Op Note (Signed)
[facility] Patient Name: Patrick [name] Procedure Date: 03/02/2020 11:57 AM MRN: [id number] Date of Birth: 1955/12/20 Attending MD: Elon Alas. Abbey Chatters DO CSN: [id number] Age: 64 Admit Type: Outpatient Procedure:                Colonoscopy Indications:              Abnormal CT of the GI tract Providers:                Elon Alas. Abbey Chatters, DO, Janeece Riggers, RN, Lambert Mody, Casimer Bilis, Technician Referring MD:              Medicines:                See the Anesthesia note for documentation of the                            administered medications Complications:            No immediate complications. Estimated Blood Loss:     Estimated blood loss was minimal. Procedure:                Pre-Anesthesia Assessment:                           - The anesthesia plan was to use monitored                            anesthesia care (MAC).                           After obtaining informed consent, the colonoscope                            was passed under direct vision. Throughout the                            procedure, the patient's blood pressure, pulse, and                            oxygen saturations were monitored continuously. The                            PCF-[id number] ([id number]) scope was introduced through                            the anus and advanced to the the terminal ileum,                            with identification of the appendiceal orifice and                            IC valve. The colonoscopy was performed without                            difficulty.  The patient tolerated the procedure                            well. The quality of the bowel preparation was                            evaluated using the BBPS [facility] Bowel Preparation                            Scale) with scores of: Right Colon = 2 (minor                            amount of residual staining, small fragments of                            stool and/or  opaque liquid, but mucosa seen well),                            Transverse Colon = 3 (entire mucosa seen well with                            no residual staining, small fragments of stool or                            opaque liquid) and Left Colon = 3 (entire mucosa                            seen well with no residual staining, small                            fragments of stool or opaque liquid). The total                            BBPS score equals 8. The quality of the bowel                            preparation was good. Scope In: 12:00:01 PM Scope Out: 12:17:49 PM Scope Withdrawal Time: 0 hours 15 minutes 14 seconds  Total Procedure Duration: 0 hours 17 minutes 48 seconds  Findings:      The perianal and digital rectal examinations were normal.      Non-bleeding internal hemorrhoids were found during endoscopy.      A few small-mouthed diverticula were found in the sigmoid colon.      A 2 mm polyp was found in the transverse colon. The polyp was sessile.       The polyp was removed with a cold biopsy forceps. Resection and       retrieval were complete.      The terminal ileum appeared normal. Biopsies were taken with a cold       forceps for histology. Impression:               - Non-bleeding internal hemorrhoids.                           -  Diverticulosis in the sigmoid colon.                           - One 2 mm polyp in the transverse colon, removed                            with a cold biopsy forceps. Resected and retrieved.                           - The examined portion of the ileum was normal.                            Biopsied. Moderate Sedation:      Per Anesthesia Care Recommendation:           - Patient has a contact number available for                            emergencies. The signs and symptoms of potential                            delayed complications were discussed with the                            patient. Return to normal activities tomorrow.                             Written discharge instructions were provided to the                            patient.                           - Resume previous diet.                           - Continue present medications.                           - Await pathology results.                           - Repeat colonoscopy in 7-10 years for surveillance                            based on pathology results.                           - Return to GI clinic with Roseanne Kaufman in 8 weeks. Procedure Code(s):        --- Professional ---                           [phone], Colonoscopy, flexible; with biopsy, single                            or multiple Diagnosis Code(s):        ---  Professional ---                           K64.8, Other hemorrhoids                           K63.5, Polyp of colon                           K57.30, Diverticulosis of large intestine without                            perforation or abscess without bleeding                           R93.3, Abnormal findings on diagnostic imaging of                            other parts of digestive tract CPT copyright 2019 American Medical Association. All rights reserved. The codes documented in this report are preliminary and upon coder review may  be revised to meet current compliance requirements. Elon Alas. Abbey Chatters, [place] Abbey Chatters, DO 03/02/2020 12:23:40 PM This report has been signed electronically. Number of Addenda: 0

## 2020-03-02 NOTE — Discharge Instructions (Addendum)
EGD Discharge instructions Please read the instructions outlined below and refer to this sheet in the next few weeks. These discharge instructions provide you with general information on caring for yourself after you leave the hospital. Your doctor may also give you specific instructions. While your treatment has been planned according to the most current medical practices available, unavoidable complications occasionally occur. If you have any problems or questions after discharge, please call your doctor. ACTIVITY  You may resume your regular activity but move at a slower pace for the next 24 hours.   Take frequent rest periods for the next 24 hours.   Walking will help expel (get rid of) the air and reduce the bloated feeling in your abdomen.   No driving for 24 hours (because of the anesthesia (medicine) used during the test).   You may shower.   Do not sign any important legal documents or operate any machinery for 24 hours (because of the anesthesia used during the test).  NUTRITION  Drink plenty of fluids.   You may resume your normal diet.   Begin with a light meal and progress to your normal diet.   Avoid alcoholic beverages for 24 hours or as instructed by your caregiver.  MEDICATIONS  You may resume your normal medications unless your caregiver tells you otherwise.  WHAT YOU CAN EXPECT TODAY  You may experience abdominal discomfort such as a feeling of fullness or "gas" pains.  FOLLOW-UP  Your doctor will discuss the results of your test with you.  SEEK IMMEDIATE MEDICAL ATTENTION IF ANY OF THE FOLLOWING OCCUR:  Excessive nausea (feeling sick to your stomach) and/or vomiting.   Severe abdominal pain and distention (swelling).   Trouble swallowing.   Temperature over 101 F (37.8 C).   Rectal bleeding or vomiting of blood.     Colonoscopy Discharge Instructions  Read the instructions outlined below and refer to this sheet in the next few weeks. These  discharge instructions provide you with general information on caring for yourself after you leave the hospital. Your doctor may also give you specific instructions. While your treatment has been planned according to the most current medical practices available, unavoidable complications occasionally occur.   ACTIVITY  You may resume your regular activity, but move at a slower pace for the next 24 hours.   Take frequent rest periods for the next 24 hours.   Walking will help get rid of the air and reduce the bloated feeling in your belly (abdomen).   No driving for 24 hours (because of the medicine (anesthesia) used during the test).    Do not sign any important legal documents or operate any machinery for 24 hours (because of the anesthesia used during the test).  NUTRITION  Drink plenty of fluids.   You may resume your normal diet as instructed by your doctor.   Begin with a light meal and progress to your normal diet. Heavy or fried foods are harder to digest and may make you feel sick to your stomach (nauseated).   Avoid alcoholic beverages for 24 hours or as instructed.  MEDICATIONS  You may resume your normal medications unless your doctor tells you otherwise.  WHAT YOU CAN EXPECT TODAY  Some feelings of bloating in the abdomen.   Passage of more gas than usual.   Spotting of blood in your stool or on the toilet paper.  IF YOU HAD POLYPS REMOVED DURING THE COLONOSCOPY:  No aspirin products for 7 days or as instructed.  No alcohol for 7 days or as instructed.   Eat a soft diet for the next 24 hours.  FINDING OUT THE RESULTS OF YOUR TEST Not all test results are available during your visit. If your test results are not back during the visit, make an appointment with your caregiver to find out the results. Do not assume everything is normal if you have not heard from your caregiver or the medical facility. It is important for you to follow up on all of your test results.    SEEK IMMEDIATE MEDICAL ATTENTION IF:  You have more than a spotting of blood in your stool.   Your belly is swollen (abdominal distention).   You are nauseated or vomiting.   You have a temperature over 101.   You have abdominal pain or discomfort that is severe or gets worse throughout the day.   Your EGD showed mild to moderate inflammation in the stomach.  I biopsied this to rule out a bacterial infection called H. pylori.  You also had a Schatzki's ring in your lower esophagus which is a tightening from chronic reflux.  I obliterated this with biopsy forceps.  Hopefully this will help with your swallowing.  We may need to consider repeat EGD with balloon dilation in the future if your symptoms do not improve.  In the meantime, I want you to increase your omeprazole to 40 mg twice daily.  I sent a new prescription to your pharmacy.  Your colonoscopy was largely unremarkable.  You had 1 small polyp which I removed successfully.  Depending on the pathology we will need to repeat your colonoscopy in 7 to 10 years.  My office will contact you next week with these results.  The terminal ileum which is the end portion of your small bowel that appeared abnormal on CT imaging looked healthy today on colonoscopy.  I did take biopsies of this to and we will have these results back next week.  We may need to consider a small bowel capsule endoscopy in the future for completeness sake's.  We can talk about this more on follow-up visit in the GI clinic.  I hope you have a great rest of your week!  Elon Alas. Abbey Chatters, D.O. Gastroenterology and Hepatology [facility]      Monitored Anesthesia Care, Care After These instructions provide you with information about caring for yourself after your procedure. Your health care provider may also give you more specific instructions. Your treatment has been planned according to current medical practices, but problems sometimes occur.  Call your health care provider if you have any problems or questions after your procedure. What can I expect after the procedure? After your procedure, you may:  Feel sleepy for several hours.  Feel clumsy and have poor balance for several hours.  Feel forgetful about what happened after the procedure.  Have poor judgment for several hours.  Feel nauseous or vomit.  Have a sore throat if you had a breathing tube during the procedure. Follow these instructions at home: For at least 24 hours after the procedure:      Have a responsible adult stay with you. It is important to have someone help care for you until you are awake and alert.  Rest as needed.  Do not: ? Participate in activities in which you could fall or become injured. ? Drive. ? Use heavy machinery. ? Drink alcohol. ? Take sleeping pills or medicines that cause drowsiness. ? Make important decisions or  sign legal documents. ? Take care of children on your own. Eating and drinking  Follow the diet that is recommended by your health care provider.  If you vomit, drink water, juice, or soup when you can drink without vomiting.  Make sure you have little or no nausea before eating solid foods. General instructions  Take over-the-counter and prescription medicines only as told by your health care provider.  If you have sleep apnea, surgery and certain medicines can increase your risk for breathing problems. Follow instructions from your health care provider about wearing your sleep device: ? Anytime you are sleeping, including during daytime naps. ? While taking prescription pain medicines, sleeping medicines, or medicines that make you drowsy.  If you smoke, do not smoke without supervision.  Keep all follow-up visits as told by your health care provider. This is important. Contact a health care provider if:  You keep feeling nauseous or you keep vomiting.  You feel light-headed.  You develop a  rash.  You have a fever. Get help right away if:  You have trouble breathing. Summary  For several hours after your procedure, you may feel sleepy and have poor judgment.  Have a responsible adult stay with you for at least 24 hours or until you are awake and alert. This information is not intended to replace advice given to you by your health care provider. Make sure you discuss any questions you have with your health care provider. Document Revised: 09/21/2017 Document Reviewed: 10/14/2015 Elsevier Patient Education  [place].

## 2020-03-02 NOTE — Anesthesia Preprocedure Evaluation (Signed)
Anesthesia Evaluation  Patient identified by MRN, date of birth, ID band Patient awake    Reviewed: Allergy & Precautions, H&P , NPO status , Patient's Chart, lab work & pertinent test results, reviewed documented beta blocker date and time   Airway Mallampati: II  TM Distance: >3 FB Neck ROM: full    Dental no notable dental hx. (+) Teeth Intact   Pulmonary neg pulmonary ROS, former smoker,    Pulmonary exam normal breath sounds clear to auscultation       Cardiovascular Exercise Tolerance: Good hypertension, negative cardio ROS   Rhythm:regular Rate:Normal     Neuro/Psych TIACVA negative psych ROS   GI/Hepatic negative GI ROS, Neg liver ROS,   Endo/Other  negative endocrine ROS  Renal/GU negative Renal ROS  negative genitourinary   Musculoskeletal   Abdominal   Peds  Hematology negative hematology ROS (+)   Anesthesia Other Findings   Reproductive/Obstetrics negative OB ROS                             Anesthesia Physical Anesthesia Plan  ASA: III  Anesthesia Plan: General   Post-op Pain Management:    Induction:   PONV Risk Score and Plan: Propofol infusion  Airway Management Planned:   Additional Equipment:   Intra-op Plan:   Post-operative Plan:   Informed Consent: I have reviewed the patients History and Physical, chart, labs and discussed the procedure including the risks, benefits and alternatives for the proposed anesthesia with the patient or authorized representative who has indicated his/her understanding and acceptance.     Dental Advisory Given  Plan Discussed with: CRNA  Anesthesia Plan Comments:         Anesthesia Quick Evaluation

## 2020-03-02 NOTE — Anesthesia Postprocedure Evaluation (Signed)
Anesthesia Post Note  Patient: HEATHER STREEPER  Procedure(s) Performed: COLONOSCOPY WITH PROPOFOL (N/A ) ESOPHAGOGASTRODUODENOSCOPY (EGD) WITH PROPOFOL (N/A ) BIOPSY  Patient location during evaluation: Endoscopy Anesthesia Type: General Level of consciousness: awake and alert and oriented Pain management: pain level controlled Vital Signs Assessment: post-procedure vital signs reviewed and stable Respiratory status: spontaneous breathing Cardiovascular status: blood pressure returned to baseline and stable Postop Assessment: no apparent nausea or vomiting Anesthetic complications: no   No complications documented.   Last Vitals:  Vitals:   03/02/20 1027  BP: (!) 150/81  Pulse: 81  Resp: 14  Temp: 37.1 C  SpO2: 100%    Last Pain:  Vitals:   03/02/20 1147  TempSrc:   PainSc: 0-No pain                 Soraida Vickers

## 2020-03-02 NOTE — H&P (Signed)
Primary Care Physician:  Manon Hilding, MD Primary Gastroenterologist:  Dr. Abbey Chatters  Pre-Procedure History & Physical: HPI:  Patrick [name] is a 64 y.o. male is here for an EGD and diagnostic colonoscopy due to history of dysphagia and abnormal CT imaging.   Past Medical History:  Diagnosis Date  . 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 Hypertension   . Hypertriglyceridemia   . Stroke [facility])     Past Surgical History:  Procedure Laterality Date  . HERNIA REPAIR     bilateral    Prior to Admission medications   Medication Sig Start Date End Date Taking? Authorizing Provider  HYDROcodone-acetaminophen (NORCO/VICODIN) 5-325 MG tablet Take 1 tablet by mouth every 4 (four) hours as needed. 02/26/20  Yes [provider]  ciprofloxacin (CIPRO) 500 MG tablet Take 500 mg by mouth 2 (two) times daily. 02/27/20   [provider]  clopidogrel (PLAVIX) 75 MG tablet Take 75 mg by mouth daily.  08/17/12   [provider]  DULoxetine (CYMBALTA) 60 MG capsule Take 1 capsule by mouth daily. 06/05/19   [provider]  omeprazole (PRILOSEC) 40 MG capsule Take 40 mg by mouth daily. 12/13/19   [provider]  ondansetron (ZOFRAN-ODT) 4 MG disintegrating tablet Take 4 mg by mouth every 8 (eight) hours as needed. 02/26/20   [provider]  potassium chloride SA (KLOR-CON) 20 MEQ tablet Take 20 mEq by mouth daily. 01/14/20   [provider]  predniSONE (DELTASONE) 20 MG tablet Take 1 tablet by mouth daily. x7 days 02/27/20   [provider]  quinapril-hydrochlorothiazide (ACCURETIC) 20-25 MG per tablet Take 1 tablet by mouth daily.      [provider]  rosuvastatin (CRESTOR) 20 MG tablet Take 20 mg by mouth daily.    [provider]  topiramate (TOPAMAX) 100 MG tablet Take 100 mg by mouth daily. 12/13/19   [provider]    Allergies as of 02/29/2020  . (No Known Allergies)    Family History   Problem Relation Age of Onset  . Cancer Mother 13  . Colon cancer Mother   . Heart attack Father     Social History   Socioeconomic History  . Marital status: Married    Spouse name: Not on file  . Number of children: Not on file  . Years of education: Not on file  . Highest education level: Not on file  Occupational History  . Not on file  Tobacco Use  . Smoking status: Former Smoker    Quit date: 09/09/1974    Years since quitting: 45.5  . Smokeless tobacco: Never Used  Vaping Use  . Vaping Use: Never used  Substance and Sexual Activity  . Alcohol use: Yes    Comment: occasional beer  . Drug use: No  . Sexual activity: Yes    Birth control/protection: None  Other Topics Concern  . Not on file  Social History Narrative  . Not on file   Social Determinants of Health   Financial Resource Strain:   . Difficulty of Paying Living Expenses: Not on file  Food Insecurity:   . Worried About Charity fundraiser in the Last Year: Not on file  . Ran Out of Food in the Last Year: Not on file  Transportation Needs:   . Lack of Transportation (Medical): Not on file  . Lack of Transportation (Non-Medical): Not on file  Physical Activity:   . Days of  Exercise per Week: Not on file  . Minutes of Exercise per Session: Not on file  Stress:   . Feeling of Stress : Not on file  Social Connections:   . Frequency of Communication with Friends and Family: Not on file  . Frequency of Social Gatherings with Friends and Family: Not on file  . Attends Religious Services: Not on file  . Active Member of Clubs or Organizations: Not on file  . Attends Archivist Meetings: Not on file  . Marital Status: Not on file  Intimate Partner Violence:   . Fear of Current or Ex-Partner: Not on file  . Emotionally Abused: Not on file  . Physically Abused: Not on file  . Sexually Abused: Not on file    Review of Systems: See HPI, otherwise negative ROS  Impression/Plan: Patrick [name] is here for an EGD and colonoscopy to be performed for dysphagia and abnormal CT imaging.  Risks, benefits, limitations, imponderables and alternatives regarding EGD and colonoscopy have been reviewed with the patient. Questions have been answered. All parties agreeable.

## 2020-03-02 NOTE — Transfer of Care (Signed)
Immediate Anesthesia Transfer of Care Note  Patient: Patrick [name]  Procedure(s) Performed: COLONOSCOPY WITH PROPOFOL (N/A ) ESOPHAGOGASTRODUODENOSCOPY (EGD) WITH PROPOFOL (N/A ) BIOPSY  Patient Location: Endoscopy Unit  Anesthesia Type:General  Level of Consciousness: awake  Airway & Oxygen Therapy: Patient Spontanous Breathing  Post-op Assessment: Report given to RN  Post vital signs: Reviewed  Last Vitals:  Vitals Value Taken Time  BP    Temp    Pulse    Resp    SpO2      Last Pain:  Vitals:   03/02/20 1147  TempSrc:   PainSc: 0-No pain      Patients Stated Pain Goal: 5 (15/86/82 5749)  Complications: No complications documented.

## 2020-03-06 ENCOUNTER — Other Ambulatory Visit: Payer: Self-pay

## 2020-03-06 LAB — SURGICAL PATHOLOGY

## 2020-03-07 ENCOUNTER — Encounter (HOSPITAL_COMMUNITY): Payer: Self-pay | Admitting: Internal Medicine

## 2020-05-23 ENCOUNTER — Ambulatory Visit: Payer: Medicare Other | Admitting: Gastroenterology

## 2020-08-11 DIAGNOSIS — R030 Elevated blood-pressure reading, without diagnosis of hypertension: Secondary | ICD-10-CM | POA: Diagnosis not present

## 2020-08-11 DIAGNOSIS — Z20822 Contact with and (suspected) exposure to covid-19: Secondary | ICD-10-CM | POA: Diagnosis not present

## 2020-08-17 DIAGNOSIS — I10 Essential (primary) hypertension: Secondary | ICD-10-CM | POA: Diagnosis not present

## 2020-08-17 DIAGNOSIS — E782 Mixed hyperlipidemia: Secondary | ICD-10-CM | POA: Diagnosis not present

## 2020-08-17 DIAGNOSIS — E7800 Pure hypercholesterolemia, unspecified: Secondary | ICD-10-CM | POA: Diagnosis not present

## 2020-08-17 DIAGNOSIS — N183 Chronic kidney disease, stage 3 unspecified: Secondary | ICD-10-CM | POA: Diagnosis not present

## 2020-08-17 DIAGNOSIS — E7849 Other hyperlipidemia: Secondary | ICD-10-CM | POA: Diagnosis not present

## 2020-08-30 DIAGNOSIS — R4781 Slurred speech: Secondary | ICD-10-CM | POA: Diagnosis not present

## 2020-08-30 DIAGNOSIS — E7849 Other hyperlipidemia: Secondary | ICD-10-CM | POA: Diagnosis not present

## 2020-08-30 DIAGNOSIS — I10 Essential (primary) hypertension: Secondary | ICD-10-CM | POA: Diagnosis not present

## 2020-08-30 DIAGNOSIS — G44309 Post-traumatic headache, unspecified, not intractable: Secondary | ICD-10-CM | POA: Diagnosis not present

## 2020-08-30 DIAGNOSIS — E876 Hypokalemia: Secondary | ICD-10-CM | POA: Diagnosis not present

## 2020-08-30 DIAGNOSIS — R42 Dizziness and giddiness: Secondary | ICD-10-CM | POA: Diagnosis not present

## 2020-09-25 DIAGNOSIS — I6523 Occlusion and stenosis of bilateral carotid arteries: Secondary | ICD-10-CM | POA: Diagnosis not present

## 2020-09-29 ENCOUNTER — Other Ambulatory Visit: Payer: Self-pay

## 2020-09-29 DIAGNOSIS — R509 Fever, unspecified: Secondary | ICD-10-CM | POA: Insufficient documentation

## 2020-09-29 DIAGNOSIS — R059 Cough, unspecified: Secondary | ICD-10-CM | POA: Insufficient documentation

## 2020-09-29 DIAGNOSIS — J111 Influenza due to unidentified influenza virus with other respiratory manifestations: Secondary | ICD-10-CM | POA: Insufficient documentation

## 2020-09-29 DIAGNOSIS — J209 Acute bronchitis, unspecified: Secondary | ICD-10-CM | POA: Insufficient documentation

## 2020-09-29 DIAGNOSIS — M791 Myalgia, unspecified site: Secondary | ICD-10-CM | POA: Insufficient documentation

## 2020-09-29 DIAGNOSIS — I6522 Occlusion and stenosis of left carotid artery: Secondary | ICD-10-CM

## 2020-10-04 ENCOUNTER — Other Ambulatory Visit: Payer: Self-pay

## 2020-10-04 ENCOUNTER — Ambulatory Visit (HOSPITAL_COMMUNITY)
Admission: RE | Admit: 2020-10-04 | Discharge: 2020-10-04 | Disposition: A | Discharge status: Home / Self Care | Payer: Medicare Other | Source: Ambulatory Visit | Attending: Vascular Surgery | Admitting: Vascular Surgery

## 2020-10-04 ENCOUNTER — Ambulatory Visit: Payer: Medicare Other | Admitting: Vascular Surgery

## 2020-10-04 ENCOUNTER — Encounter: Payer: Self-pay | Admitting: Vascular Surgery

## 2020-10-04 VITALS — BP 135/87 | HR 73 | Temp 98.2°F | Resp 20 | Ht 69.0 in | Wt 177.0 lb

## 2020-10-04 DIAGNOSIS — I6521 Occlusion and stenosis of right carotid artery: Secondary | ICD-10-CM

## 2020-10-04 DIAGNOSIS — I6522 Occlusion and stenosis of left carotid artery: Secondary | ICD-10-CM | POA: Diagnosis not present

## 2020-10-04 NOTE — H&P (View-Only) (Signed)
Referring Physician: Dr. Consuello Masse  Patient name: Patrick [name] MRN: [id number] DOB: 1956-04-14 Sex: male  REASON FOR CONSULT: Asymptomatic right internal carotid artery stenosis  HPI: Patrick [name] is a 65 y.o. male, with known history of right internal carotid artery stenosis which was moderate in the past.  He was last seen in 2016 but never returned for follow-up.  His initial carotid duplex in 2014 was done for syncope work-up.  He has had no new neurologic events of TIA amaurosis or stroke.  He did have a head injury in 2016 which resulted in a skull fracture and has left him with short-term memory loss chronic headaches occasional stuttering and balance issues.  He is currently on Plavix aspirin and Crestor.  His family history is remarkable for his father died of a myocardial infarction at age 46 but was a smoker.  His brother had a myocardial infarction at age 78.  He occasionally gets short of breath on a walk but this is improving.  He does not get chest pain.  He states he can go up a couple flights of stairs without chest pain or shortness of breath.  He does complain of occasional numbness and tingling in his fingers but this is bilateral.  Other medical problems include hypertension elevated cholesterol.  These are both currently stable.  He quit smoking proximately 30 years ago.  Past Medical History:  Diagnosis Date  . Allergy   . 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 Hypertension   . Hypertriglyceridemia   . Stroke [facility])    Past Surgical History:  Procedure Laterality Date  . BIOPSY  03/02/2020   Procedure: BIOPSY;  Surgeon: Eloise Harman, DO;  Location: [facility];  Service: Endoscopy;;  . COLONOSCOPY WITH PROPOFOL N/A 03/02/2020   Procedure: COLONOSCOPY WITH PROPOFOL;  Surgeon: Eloise Harman, DO;  Location: [facility];  Service: Endoscopy;  Laterality: N/A;  12:00pm  . ESOPHAGOGASTRODUODENOSCOPY (EGD) WITH PROPOFOL N/A 03/02/2020    Procedure: ESOPHAGOGASTRODUODENOSCOPY (EGD) WITH PROPOFOL;  Surgeon: Eloise Harman, DO;  Location: [facility];  Service: Endoscopy;  Laterality: N/A;  . HERNIA REPAIR     bilateral    Family History  Problem Relation Age of Onset  . Cancer Mother 46  . Colon cancer Mother   . Heart attack Father     SOCIAL HISTORY: Social History   Socioeconomic History  . Marital status: Married    Spouse name: Not on file  . Number of children: Not on file  . Years of education: Not on file  . Highest education level: Not on file  Occupational History  . Not on file  Tobacco Use  . Smoking status: Former Smoker    Quit date: 09/09/1974    Years since quitting: 46.1  . Smokeless tobacco: Never Used  Vaping Use  . Vaping Use: Never used  Substance and Sexual Activity  . Alcohol use: Yes    Comment: occasional beer  . Drug use: No  . Sexual activity: Yes    Birth control/protection: None  Other Topics Concern  . Not on file  Social History Narrative  . Not on file   Social Determinants of Health   Financial Resource Strain: Not on file  Food Insecurity: Not on file  Transportation Needs: Not on file  Physical Activity: Not on file  Stress: Not on file  Social Connections: Not on file  Intimate Partner Violence: Not on  file    Allergies  Allergen Reactions  . Amitriptyline     Other reaction(s): Other (See Comments) Pt states had hallucinations Pt states had hallucinations     Current Outpatient Medications  Medication Sig Dispense Refill  . clopidogrel (PLAVIX) 75 MG tablet Take 75 mg by mouth daily.     . DULoxetine (CYMBALTA) 60 MG capsule Take 1 capsule by mouth daily.    . fluticasone (FLONASE) 50 MCG/ACT nasal spray Place into both nostrils.    Marland Kitchen omeprazole (PRILOSEC) 40 MG capsule Take 1 capsule (40 mg total) by mouth in the morning and at bedtime. 60 capsule 5  . potassium chloride SA (KLOR-CON) 20 MEQ tablet Take 20 mEq by mouth daily.    .  quinapril-hydrochlorothiazide (ACCURETIC) 20-25 MG per tablet Take 1 tablet by mouth daily.    . rosuvastatin (CRESTOR) 20 MG tablet Take 20 mg by mouth daily.    Marland Kitchen topiramate (TOPAMAX) 100 MG tablet Take 100 mg by mouth daily.     No current facility-administered medications for this visit.    ROS:   General:  No weight loss, Fever, chills  HEENT: No recent headaches, no nasal bleeding, no visual changes, no sore throat  Neurologic: No dizziness, blackouts, seizures. No recent symptoms of stroke or mini- stroke. No recent episodes of slurred speech, or temporary blindness.  Cardiac: No recent episodes of chest pain/pressure, no shortness of breath at rest.  No shortness of breath with exertion.  Denies history of atrial fibrillation or irregular heartbeat  Vascular: No history of rest pain in feet.  No history of claudication.  No history of non-healing ulcer, No history of DVT   Pulmonary: No home oxygen, no productive cough, no hemoptysis,  No asthma or wheezing  Musculoskeletal:  [ ]  Arthritis, [ ]  Low back pain,  [ ]  Joint pain  Hematologic:No history of hypercoagulable state.  No history of easy bleeding.  No history of anemia  Gastrointestinal: No hematochezia or melena,  No gastroesophageal reflux, no trouble swallowing  Urinary: [ ]  chronic Kidney disease, [ ]  on HD - [ ]  MWF or [ ]  TTHS, [ ]  Burning with urination, [ ]  Frequent urination, [ ]  Difficulty urinating;   Skin: No rashes  Psychological: No history of anxiety,  No history of depression   Physical Examination  Vitals:   10/04/20 0831 10/04/20 0834  BP: 122/80 135/87  Pulse: 73   Resp: 20   Temp: 98.2 F (36.8 C)   SpO2: 97%   Weight: 177 lb (80.3 kg)   Height: 5\' 9"  (1.753 m)     Body mass index is 26.14 kg/m.  General:  Alert and oriented, no acute distress HEENT: Normal Neck: No JVD Cardiac: Regular Rate and Rhythm Abdomen: Soft, non-tender, non-distended, no mass Skin: No rash Extremity  Pulses:  2+ radial, brachial, femoral, dorsalis pedis, posterior tibial pulses bilaterally Musculoskeletal: No deformity or edema  Neurologic: Upper and lower extremity motor 5/5 and symmetric  DATA:  I reviewed the patient's recent duplex exam at [facility] radiology which suggested greater than 70% right internal carotid artery stenosis.  The patient had a repeat carotid duplex exam in our office today.  This showed no significant left-sided stenosis and greater than 80% right internal carotid artery stenosis.  I reviewed and interpreted these images.  ASSESSMENT: Asymptomatic greater than 80% right internal carotid artery stenosis   PLAN: Risk benefits possible complications and procedure details were discussed with the patient and his wife today.  This was regarding right carotid endarterectomy versus medical management of carotid stenosis.  I discussed with him that risk of stroke is about 5 %/year with medical management alone.  Discussed with him risk reduction of stroke risk with carotid endarterectomy.  We did discuss the risks of carotid endarterectomy including 1% chance of stroke, 10% cranial nerve injuries which are usually transient, bleeding with possible neck hematoma and infection.  These were all very low risk.  He understands and wishes to proceed.  He had a trip scheduled in the next couple of weeks and wished to defer his carotid endarterectomy until April 18.  We will stop his Plavix on October 15, 2020.  He will continue his aspirin and his statin perioperatively.  Ruta Hinds, MD [facility] Office: [phone]

## 2020-10-04 NOTE — Progress Notes (Signed)
Referring Physician: Dr. Consuello Masse  Patient name: JAHN FRANCHINI MRN: [id number] DOB: [birth year] Sex: male  REASON FOR CONSULT: Asymptomatic right internal carotid artery stenosis  HPI: Patrick [name] is a 65 y.o. male, with known history of right internal carotid artery stenosis which was moderate in the past.  He was last seen in 2016 but never returned for follow-up.  His initial carotid duplex in 2014 was done for syncope work-up.  He has had no new neurologic events of TIA amaurosis or stroke.  He did have a head injury in 2016 which resulted in a skull fracture and has left him with short-term memory loss chronic headaches occasional stuttering and balance issues.  He is currently on Plavix aspirin and Crestor.  His family history is remarkable for his father died of a myocardial infarction at age 43 but was a smoker.  His brother had a myocardial infarction at age 69.  He occasionally gets short of breath on a walk but this is improving.  He does not get chest pain.  He states he can go up a couple flights of stairs without chest pain or shortness of breath.  He does complain of occasional numbness and tingling in his fingers but this is bilateral.  Other medical problems include hypertension elevated cholesterol.  These are both currently stable.  He quit smoking proximately 30 years ago.  Past Medical History:  Diagnosis Date  . Allergy   . 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 Hypertension   . Hypertriglyceridemia   . Stroke [facility])    Past Surgical History:  Procedure Laterality Date  . BIOPSY  03/02/2020   Procedure: BIOPSY;  Surgeon: Eloise Harman, DO;  Location: [facility];  Service: Endoscopy;;  . COLONOSCOPY WITH PROPOFOL N/A 03/02/2020   Procedure: COLONOSCOPY WITH PROPOFOL;  Surgeon: Eloise Harman, DO;  Location[facility];  Service: Endoscopy;  Laterality: N/A;  12:00pm  . ESOPHAGOGASTRODUODENOSCOPY (EGD) WITH PROPOFOL N/A 03/02/2020    Procedure: ESOPHAGOGASTRODUODENOSCOPY (EGD) WITH PROPOFOL;  Surgeon: Eloise Harman, DO;  Location: [facility];  Service: Endoscopy;  Laterality: N/A;  . HERNIA REPAIR     bilateral    Family History  Problem Relation Age of Onset  . Cancer Mother 41  . Colon cancer Mother   . Heart attack Father     SOCIAL HISTORY: Social History   Socioeconomic History  . Marital status: Married    Spouse name: Not on file  . Number of children: Not on file  . Years of education: Not on file  . Highest education level: Not on file  Occupational History  . Not on file  Tobacco Use  . Smoking status: Former Smoker    Quit date: 09/09/1974    Years since quitting: 46.1  . Smokeless tobacco: Never Used  Vaping Use  . Vaping Use: Never used  Substance and Sexual Activity  . Alcohol use: Yes    Comment: occasional beer  . Drug use: No  . Sexual activity: Yes    Birth control/protection: None  Other Topics Concern  . Not on file  Social History Narrative  . Not on file   Social Determinants of Health   Financial Resource Strain: Not on file  Food Insecurity: Not on file  Transportation Needs: Not on file  Physical Activity: Not on file  Stress: Not on file  Social Connections: Not on file  Intimate Partner Violence: Not on  file    Allergies  Allergen Reactions  . Amitriptyline     Other reaction(s): Other (See Comments) Pt states had hallucinations Pt states had hallucinations     Current Outpatient Medications  Medication Sig Dispense Refill  . clopidogrel (PLAVIX) 75 MG tablet Take 75 mg by mouth daily.     . DULoxetine (CYMBALTA) 60 MG capsule Take 1 capsule by mouth daily.    . fluticasone (FLONASE) 50 MCG/ACT nasal spray Place into both nostrils.    Marland Kitchen omeprazole (PRILOSEC) 40 MG capsule Take 1 capsule (40 mg total) by mouth in the morning and at bedtime. 60 capsule 5  . potassium chloride SA (KLOR-CON) 20 MEQ tablet Take 20 mEq by mouth daily.    .  quinapril-hydrochlorothiazide (ACCURETIC) 20-25 MG per tablet Take 1 tablet by mouth daily.    . rosuvastatin (CRESTOR) 20 MG tablet Take 20 mg by mouth daily.    Marland Kitchen topiramate (TOPAMAX) 100 MG tablet Take 100 mg by mouth daily.     No current facility-administered medications for this visit.    ROS:   General:  No weight loss, Fever, chills  HEENT: No recent headaches, no nasal bleeding, no visual changes, no sore throat  Neurologic: No dizziness, blackouts, seizures. No recent symptoms of stroke or mini- stroke. No recent episodes of slurred speech, or temporary blindness.  Cardiac: No recent episodes of chest pain/pressure, no shortness of breath at rest.  No shortness of breath with exertion.  Denies history of atrial fibrillation or irregular heartbeat  Vascular: No history of rest pain in feet.  No history of claudication.  No history of non-healing ulcer, No history of DVT   Pulmonary: No home oxygen, no productive cough, no hemoptysis,  No asthma or wheezing  Musculoskeletal:  [ ]  Arthritis, [ ]  Low back pain,  [ ]  Joint pain  Hematologic:No history of hypercoagulable state.  No history of easy bleeding.  No history of anemia  Gastrointestinal: No hematochezia or melena,  No gastroesophageal reflux, no trouble swallowing  Urinary: [ ]  chronic Kidney disease, [ ]  on HD - [ ]  MWF or [ ]  TTHS, [ ]  Burning with urination, [ ]  Frequent urination, [ ]  Difficulty urinating;   Skin: No rashes  Psychological: No history of anxiety,  No history of depression   Physical Examination  Vitals:   10/04/20 0831 10/04/20 0834  BP: 122/80 135/87  Pulse: 73   Resp: 20   Temp: 98.2 F (36.8 C)   SpO2: 97%   Weight: 177 lb (80.3 kg)   Height: 5\' 9"  (1.753 m)     Body mass index is 26.14 kg/m.  General:  Alert and oriented, no acute distress HEENT: Normal Neck: No JVD Cardiac: Regular Rate and Rhythm Abdomen: Soft, non-tender, non-distended, no mass Skin: No rash Extremity  Pulses:  2+ radial, brachial, femoral, dorsalis pedis, posterior tibial pulses bilaterally Musculoskeletal: No deformity or edema  Neurologic: Upper and lower extremity motor 5/5 and symmetric  DATA:  I reviewed the patient's recent duplex exam at [facility] radiology which suggested greater than 70% right internal carotid artery stenosis.  The patient had a repeat carotid duplex exam in our office today.  This showed no significant left-sided stenosis and greater than 80% right internal carotid artery stenosis.  I reviewed and interpreted these images.  ASSESSMENT: Asymptomatic greater than 80% right internal carotid artery stenosis   PLAN: Risk benefits possible complications and procedure details were discussed with the patient and his wife today.  This was regarding right carotid endarterectomy versus medical management of carotid stenosis.  I discussed with him that risk of stroke is about 5 %/year with medical management alone.  Discussed with him risk reduction of stroke risk with carotid endarterectomy.  We did discuss the risks of carotid endarterectomy including 1% chance of stroke, 10% cranial nerve injuries which are usually transient, bleeding with possible neck hematoma and infection.  These were all very low risk.  He understands and wishes to proceed.  He had a trip scheduled in the next couple of weeks and wished to defer his carotid endarterectomy until April 18.  We will stop his Plavix on October 15, 2020.  He will continue his aspirin and his statin perioperatively.  Ruta Hinds, MD [facility] Office: [phone]

## 2020-10-19 NOTE — Pre-Procedure Instructions (Signed)
Surgical Instructions    Your procedure is scheduled on Tuesday, April 19th.  Report to [facility] Main Entrance "A" at 5:30 A.M., then check in with the Admitting office.  Call this number if you have problems the morning of surgery:  [phone]   If you have any questions prior to your surgery date call [phone]: Open Monday-Friday 8am-4pm    Remember:  Do not eat or drink after midnight the night before your surgery   Take these medicines the morning of surgery with A SIP OF WATER  aspirin EC  DULoxetine (CYMBALTA)  omeprazole (PRILOSEC) rosuvastatin (CRESTOR)  topiramate (TOPAMAX)  fluticasone (FLONASE) -as needed for allergies  Per your surgeon, you were to stop clopidogrel (PLAVIX) on 10/15/20.   As of today, STOP taking any Aleve, Naproxen, Ibuprofen, Motrin, Advil, Goody's, BC's, all herbal medications, fish oil, and all vitamins.                     Do NOT Smoke (Tobacco/Vaping) or drink Alcohol 24 hours prior to your procedure.  If you use a CPAP at night, you may bring all equipment for your overnight stay.   Contacts, glasses, piercing's, hearing aid's, dentures or partials may not be worn into surgery, please bring cases for these belongings.    For patients admitted to the hospital, discharge time will be determined by your treatment team.   Patients discharged the day of surgery will not be allowed to drive home, and someone needs to stay with them for 24 hours.    Special instructions:   [place]- Preparing For Surgery  Before surgery, you can play an important role. Because skin is not sterile, your skin needs to be as free of germs as possible. You can reduce the number of germs on your skin by washing with CHG (chlorahexidine gluconate) Soap before surgery.  CHG is an antiseptic cleaner which kills germs and bonds with the skin to continue killing germs even after washing.    Oral Hygiene is also important to reduce your risk of infection.   Remember - BRUSH YOUR TEETH THE MORNING OF SURGERY WITH YOUR REGULAR TOOTHPASTE  Please do not use if you have an allergy to CHG or antibacterial soaps. If your skin becomes reddened/irritated stop using the CHG.  Do not shave (including legs and underarms) for at least 48 hours prior to first CHG shower. It is OK to shave your face.  Please follow these instructions carefully.   1. Shower the NIGHT BEFORE SURGERY and the MORNING OF SURGERY  2. If you chose to wash your hair, wash your hair first as usual with your normal shampoo.  3. After you shampoo, rinse your hair and body thoroughly to remove the shampoo.  4. Use CHG Soap as you would any other liquid soap. You can apply CHG directly to the skin and wash gently with a scrungie or a clean washcloth.   5. Apply the CHG Soap to your body ONLY FROM THE NECK DOWN.  Do not use on open wounds or open sores. Avoid contact with your eyes, ears, mouth and genitals (private parts). Wash Face and genitals (private parts)  with your normal soap.   6. Wash thoroughly, paying special attention to the area where your surgery will be performed.  7. Thoroughly rinse your body with warm water from the neck down.  8. DO NOT shower/wash with your normal soap after using and rinsing off the CHG Soap.  9. Pat yourself  dry with a CLEAN TOWEL.  10. Wear CLEAN PAJAMAS to bed the night before surgery  11. Place CLEAN SHEETS on your bed the night before your surgery  12. DO NOT SLEEP WITH PETS.   Day of Surgery: Shower with CHG soap. Do not wear jewelry. Do not wear lotions, powders, colognes, or deodorant. Men may shave face and neck. Do not bring valuables to the hospital. [facility] is not responsible for any belongings or valuables. Wear Clean/Comfortable clothing the morning of surgery Remember to brush your teeth WITH YOUR REGULAR TOOTHPASTE.   Please read over the following fact sheets that you were given.

## 2020-10-22 ENCOUNTER — Other Ambulatory Visit: Payer: Self-pay

## 2020-10-22 ENCOUNTER — Encounter (HOSPITAL_COMMUNITY)
Admission: RE | Admit: 2020-10-22 | Discharge: 2020-10-22 | Disposition: A | Discharge status: Home / Self Care | Payer: Medicare Other | Source: Ambulatory Visit | Attending: Vascular Surgery | Admitting: Vascular Surgery

## 2020-10-22 ENCOUNTER — Other Ambulatory Visit (HOSPITAL_COMMUNITY)
Admission: RE | Admit: 2020-10-22 | Discharge: 2020-10-22 | Disposition: A | Discharge status: Home / Self Care | Payer: Medicare Other | Source: Ambulatory Visit | Attending: Vascular Surgery | Admitting: Vascular Surgery

## 2020-10-22 ENCOUNTER — Encounter (HOSPITAL_COMMUNITY): Payer: Self-pay

## 2020-10-22 DIAGNOSIS — Z888 Allergy status to other drugs, medicaments and biological substances status: Secondary | ICD-10-CM | POA: Diagnosis not present

## 2020-10-22 DIAGNOSIS — M1990 Unspecified osteoarthritis, unspecified site: Secondary | ICD-10-CM | POA: Diagnosis not present

## 2020-10-22 DIAGNOSIS — Z800 Family history of malignant neoplasm of digestive organs: Secondary | ICD-10-CM | POA: Diagnosis not present

## 2020-10-22 DIAGNOSIS — Z8249 Family history of ischemic heart disease and other diseases of the circulatory system: Secondary | ICD-10-CM | POA: Diagnosis not present

## 2020-10-22 DIAGNOSIS — Z01818 Encounter for other preprocedural examination: Secondary | ICD-10-CM | POA: Insufficient documentation

## 2020-10-22 DIAGNOSIS — Z87891 Personal history of nicotine dependence: Secondary | ICD-10-CM | POA: Diagnosis not present

## 2020-10-22 DIAGNOSIS — I10 Essential (primary) hypertension: Secondary | ICD-10-CM | POA: Diagnosis not present

## 2020-10-22 DIAGNOSIS — E7800 Pure hypercholesterolemia, unspecified: Secondary | ICD-10-CM | POA: Diagnosis not present

## 2020-10-22 DIAGNOSIS — I129 Hypertensive chronic kidney disease with stage 1 through stage 4 chronic kidney disease, or unspecified chronic kidney disease: Secondary | ICD-10-CM | POA: Diagnosis not present

## 2020-10-22 DIAGNOSIS — K219 Gastro-esophageal reflux disease without esophagitis: Secondary | ICD-10-CM | POA: Diagnosis not present

## 2020-10-22 DIAGNOSIS — Z20822 Contact with and (suspected) exposure to covid-19: Secondary | ICD-10-CM | POA: Diagnosis not present

## 2020-10-22 DIAGNOSIS — N183 Chronic kidney disease, stage 3 unspecified: Secondary | ICD-10-CM | POA: Diagnosis not present

## 2020-10-22 DIAGNOSIS — Z01812 Encounter for preprocedural laboratory examination: Secondary | ICD-10-CM | POA: Insufficient documentation

## 2020-10-22 DIAGNOSIS — I6521 Occlusion and stenosis of right carotid artery: Secondary | ICD-10-CM | POA: Diagnosis not present

## 2020-10-22 DIAGNOSIS — Z7902 Long term (current) use of antithrombotics/antiplatelets: Secondary | ICD-10-CM | POA: Diagnosis not present

## 2020-10-22 DIAGNOSIS — Z87820 Personal history of traumatic brain injury: Secondary | ICD-10-CM | POA: Diagnosis not present

## 2020-10-22 DIAGNOSIS — I739 Peripheral vascular disease, unspecified: Secondary | ICD-10-CM | POA: Diagnosis not present

## 2020-10-22 DIAGNOSIS — Z79899 Other long term (current) drug therapy: Secondary | ICD-10-CM | POA: Diagnosis not present

## 2020-10-22 DIAGNOSIS — Z8673 Personal history of transient ischemic attack (TIA), and cerebral infarction without residual deficits: Secondary | ICD-10-CM | POA: Diagnosis not present

## 2020-10-22 DIAGNOSIS — F0390 Unspecified dementia without behavioral disturbance: Secondary | ICD-10-CM | POA: Diagnosis present

## 2020-10-22 HISTORY — DX: Headache, unspecified: R51.9

## 2020-10-22 LAB — COMPREHENSIVE METABOLIC PANEL
ALT: 24 U/L (ref 0–44)
AST: 28 U/L (ref 15–41)
Albumin: 4.2 g/dL (ref 3.5–5.0)
Alkaline Phosphatase: 58 U/L (ref 38–126)
Anion gap: 10 (ref 5–15)
BUN: 19 mg/dL (ref 8–23)
CO2: 27 mmol/L (ref 22–32)
Calcium: 9.6 mg/dL (ref 8.9–10.3)
Chloride: 103 mmol/L (ref 98–111)
Creatinine, Ser: 1.24 mg/dL (ref 0.61–1.24)
GFR, Estimated: >60 mL/min (ref >60)
Glucose, Bld: 110 mg/dL — ABNORMAL HIGH (ref 70–99)
Potassium: 3.2 mmol/L — ABNORMAL LOW (ref 3.5–5.1)
Sodium: 140 mmol/L (ref 135–145)
Total Bilirubin: 0.5 mg/dL (ref 0.3–1.2)
Total Protein: 7.5 g/dL (ref 6.5–8.1)

## 2020-10-22 LAB — CBC
HCT: 45.4 % (ref 39.0–52.0)
Hemoglobin: 15.7 g/dL (ref 13.0–17.0)
MCH: 32.2 pg (ref 26.0–34.0)
MCHC: 34.6 g/dL (ref 30.0–36.0)
MCV: 93 fL (ref 80.0–100.0)
Platelets: 206 10*3/uL (ref 150–400)
RBC: 4.88 MIL/uL (ref 4.22–5.81)
RDW: 12.2 % (ref 11.5–15.5)
WBC: 5.6 10*3/uL (ref 4.0–10.5)
nRBC: 0 % (ref 0.0–0.2)

## 2020-10-22 LAB — APTT: aPTT: 30 seconds (ref 24–36)

## 2020-10-22 LAB — PROTIME-INR
INR: 1 (ref 0.8–1.2)
Prothrombin Time: 12.7 seconds (ref 11.4–15.2)

## 2020-10-22 LAB — URINALYSIS, ROUTINE W REFLEX MICROSCOPIC
Bilirubin Urine: NEGATIVE
Glucose, UA: NEGATIVE mg/dL
Hgb urine dipstick: NEGATIVE
Ketones, ur: NEGATIVE mg/dL
Leukocytes,Ua: NEGATIVE
Nitrite: NEGATIVE
Protein, ur: NEGATIVE mg/dL
Specific Gravity, Urine: 1.018 (ref 1.005–1.030)
pH: 6 (ref 5.0–8.0)

## 2020-10-22 LAB — SARS CORONAVIRUS 2 (TAT 6-24 HRS): SARS Coronavirus 2: NEGATIVE

## 2020-10-22 LAB — SURGICAL PCR SCREEN
MRSA, PCR: NEGATIVE
Staphylococcus aureus: NEGATIVE

## 2020-10-22 NOTE — Progress Notes (Signed)
PCP - Dr. Consuello Masse Cardiologist - Denies   Chest x-ray - n/a EKG - 10/22/20 Stress Test - denies ECHO - denies Cardiac Cath - denies  Sleep Study - denies  Blood Thinner Instructions: Per surgeon orders, patient stopped Plavix on 10/15/20 Aspirin Instructions: Take morning of surgery  COVID TEST- 10/22/20 Pending   Anesthesia review: Yes. Chart was sent to Karoline Caldwell, [place] for review.  Patient denies shortness of breath, fever, cough and chest pain at PAT appointment   All instructions explained to the patient, with a verbal understanding of the material. Patient agrees to go over the instructions while at home for a better understanding. Patient also instructed to self quarantine after being tested for COVID-19. The opportunity to ask questions was provided.

## 2020-10-23 ENCOUNTER — Other Ambulatory Visit: Payer: Self-pay

## 2020-10-23 ENCOUNTER — Inpatient Hospital Stay (HOSPITAL_COMMUNITY): Payer: Medicare Other

## 2020-10-23 ENCOUNTER — Encounter (HOSPITAL_COMMUNITY): Payer: Self-pay | Admitting: Vascular Surgery

## 2020-10-23 ENCOUNTER — Encounter (HOSPITAL_COMMUNITY)
Admission: RE | Disposition: A | Discharge status: Home / Self Care | Payer: Self-pay | Source: Home / Self Care | Attending: Vascular Surgery

## 2020-10-23 ENCOUNTER — Inpatient Hospital Stay (HOSPITAL_COMMUNITY)
Admission: RE | Admit: 2020-10-23 | Discharge: 2020-10-24 | DRG: 039 | Disposition: A | Discharge status: Home / Self Care | Payer: Medicare Other | Attending: Vascular Surgery | Admitting: Vascular Surgery

## 2020-10-23 DIAGNOSIS — Z8249 Family history of ischemic heart disease and other diseases of the circulatory system: Secondary | ICD-10-CM

## 2020-10-23 DIAGNOSIS — M1990 Unspecified osteoarthritis, unspecified site: Secondary | ICD-10-CM | POA: Diagnosis present

## 2020-10-23 DIAGNOSIS — I6521 Occlusion and stenosis of right carotid artery: Secondary | ICD-10-CM | POA: Diagnosis present

## 2020-10-23 DIAGNOSIS — Z8673 Personal history of transient ischemic attack (TIA), and cerebral infarction without residual deficits: Secondary | ICD-10-CM

## 2020-10-23 DIAGNOSIS — Z79899 Other long term (current) drug therapy: Secondary | ICD-10-CM

## 2020-10-23 DIAGNOSIS — I739 Peripheral vascular disease, unspecified: Secondary | ICD-10-CM | POA: Diagnosis present

## 2020-10-23 DIAGNOSIS — Z7902 Long term (current) use of antithrombotics/antiplatelets: Secondary | ICD-10-CM | POA: Diagnosis not present

## 2020-10-23 DIAGNOSIS — F0390 Unspecified dementia without behavioral disturbance: Secondary | ICD-10-CM | POA: Diagnosis present

## 2020-10-23 DIAGNOSIS — Z87820 Personal history of traumatic brain injury: Secondary | ICD-10-CM | POA: Diagnosis not present

## 2020-10-23 DIAGNOSIS — E7800 Pure hypercholesterolemia, unspecified: Secondary | ICD-10-CM | POA: Diagnosis present

## 2020-10-23 DIAGNOSIS — Z20822 Contact with and (suspected) exposure to covid-19: Secondary | ICD-10-CM | POA: Diagnosis present

## 2020-10-23 DIAGNOSIS — Z888 Allergy status to other drugs, medicaments and biological substances status: Secondary | ICD-10-CM | POA: Diagnosis not present

## 2020-10-23 DIAGNOSIS — I10 Essential (primary) hypertension: Secondary | ICD-10-CM | POA: Diagnosis present

## 2020-10-23 DIAGNOSIS — Z800 Family history of malignant neoplasm of digestive organs: Secondary | ICD-10-CM | POA: Diagnosis not present

## 2020-10-23 DIAGNOSIS — Z87891 Personal history of nicotine dependence: Secondary | ICD-10-CM | POA: Diagnosis not present

## 2020-10-23 DIAGNOSIS — I6529 Occlusion and stenosis of unspecified carotid artery: Secondary | ICD-10-CM | POA: Diagnosis present

## 2020-10-23 DIAGNOSIS — K219 Gastro-esophageal reflux disease without esophagitis: Secondary | ICD-10-CM | POA: Diagnosis present

## 2020-10-23 HISTORY — PX: ENDARTERECTOMY: SHX5162

## 2020-10-23 HISTORY — PX: PATCH ANGIOPLASTY: SHX6230

## 2020-10-23 SURGERY — ENDARTERECTOMY, CAROTID
Anesthesia: General | Site: Neck | Laterality: Right

## 2020-10-23 MED ORDER — PROTAMINE SULFATE 10 MG/ML IV SOLN
INTRAVENOUS | Status: DC | PRN
  Administered 2020-10-23: 90 mg via INTRAVENOUS

## 2020-10-23 MED ORDER — LIDOCAINE-EPINEPHRINE 1 %-1:100000 IJ SOLN
INTRAMUSCULAR | Status: AC
  Filled 2020-10-23: qty 1

## 2020-10-23 MED ORDER — ONDANSETRON HCL 4 MG/2ML IJ SOLN
INTRAMUSCULAR | Status: AC
  Filled 2020-10-23: qty 2

## 2020-10-23 MED ORDER — LACTATED RINGERS IV SOLN: INTRAVENOUS | Status: DC | PRN

## 2020-10-23 MED ORDER — POTASSIUM CHLORIDE CRYS ER 20 MEQ PO TBCR
20.0000 meq | EXTENDED_RELEASE_TABLET | Freq: Every day | ORAL | Status: DC
  Administered 2020-10-23 – 2020-10-24 (×2): 20 meq via ORAL
  Filled 2020-10-23 (×2): qty 1

## 2020-10-23 MED ORDER — DEXAMETHASONE SODIUM PHOSPHATE 10 MG/ML IJ SOLN
INTRAMUSCULAR | Status: DC | PRN
  Administered 2020-10-23: 5 mg via INTRAVENOUS

## 2020-10-23 MED ORDER — EPINEPHRINE PF 1 MG/ML IJ SOLN
INTRAMUSCULAR | Status: AC
  Filled 2020-10-23: qty 1

## 2020-10-23 MED ORDER — ONDANSETRON HCL 4 MG/2ML IJ SOLN: 4.0000 mg | Freq: Once | INTRAMUSCULAR | Status: DC | PRN

## 2020-10-23 MED ORDER — TOPIRAMATE 25 MG PO TABS
100.0000 mg | ORAL_TABLET | Freq: Every day | ORAL | Status: DC
  Administered 2020-10-23 – 2020-10-24 (×2): 100 mg via ORAL
  Filled 2020-10-23 (×2): qty 4

## 2020-10-23 MED ORDER — CEFAZOLIN SODIUM-DEXTROSE 2-4 GM/100ML-% IV SOLN
2.0000 g | INTRAVENOUS | Status: AC
  Administered 2020-10-23: 2 g via INTRAVENOUS
  Filled 2020-10-23: qty 100

## 2020-10-23 MED ORDER — MAGNESIUM SULFATE 2 GM/50ML IV SOLN: 2.0000 g | Freq: Every day | INTRAVENOUS | Status: DC | PRN

## 2020-10-23 MED ORDER — FENTANYL CITRATE (PF) 100 MCG/2ML IJ SOLN
INTRAMUSCULAR | Status: AC
  Filled 2020-10-23: qty 2

## 2020-10-23 MED ORDER — LIDOCAINE 2% (20 MG/ML) 5 ML SYRINGE
INTRAMUSCULAR | Status: AC
  Filled 2020-10-23: qty 5

## 2020-10-23 MED ORDER — CHLORHEXIDINE GLUCONATE CLOTH 2 % EX PADS: 6.0000 | MEDICATED_PAD | Freq: Once | CUTANEOUS | Status: DC

## 2020-10-23 MED ORDER — HEPARIN SODIUM (PORCINE) 1000 UNIT/ML IJ SOLN
INTRAMUSCULAR | Status: AC
  Filled 2020-10-23: qty 1

## 2020-10-23 MED ORDER — ALUM & MAG HYDROXIDE-SIMETH 200-200-20 MG/5ML PO SUSP: 15.0000 mL | ORAL | Status: DC | PRN

## 2020-10-23 MED ORDER — LACTATED RINGERS IV SOLN: INTRAVENOUS | Status: DC

## 2020-10-23 MED ORDER — MORPHINE SULFATE (PF) 2 MG/ML IV SOLN: 2.0000 mg | INTRAVENOUS | Status: DC | PRN

## 2020-10-23 MED ORDER — DEXAMETHASONE SODIUM PHOSPHATE 10 MG/ML IJ SOLN
INTRAMUSCULAR | Status: AC
  Filled 2020-10-23: qty 1

## 2020-10-23 MED ORDER — ROSUVASTATIN CALCIUM 20 MG PO TABS
20.0000 mg | ORAL_TABLET | Freq: Every day | ORAL | Status: DC
  Administered 2020-10-23 – 2020-10-24 (×2): 20 mg via ORAL
  Filled 2020-10-23 (×2): qty 1

## 2020-10-23 MED ORDER — CLOPIDOGREL BISULFATE 75 MG PO TABS
75.0000 mg | ORAL_TABLET | Freq: Every day | ORAL | Status: DC
  Administered 2020-10-24: 75 mg via ORAL
  Filled 2020-10-23: qty 1

## 2020-10-23 MED ORDER — PHENOL 1.4 % MT LIQD: 1.0000 | OROMUCOSAL | Status: DC | PRN

## 2020-10-23 MED ORDER — SODIUM CHLORIDE 0.9 % IV SOLN: 500.0000 mL | Freq: Once | INTRAVENOUS | Status: DC | PRN

## 2020-10-23 MED ORDER — GUAIFENESIN-DM 100-10 MG/5ML PO SYRP: 15.0000 mL | ORAL_SOLUTION | ORAL | Status: DC | PRN

## 2020-10-23 MED ORDER — PHENYLEPHRINE HCL-NACL 10-0.9 MG/250ML-% IV SOLN
INTRAVENOUS | Status: DC | PRN
  Administered 2020-10-23: 20 ug/min via INTRAVENOUS

## 2020-10-23 MED ORDER — PROPOFOL 10 MG/ML IV BOLUS
INTRAVENOUS | Status: AC
  Filled 2020-10-23: qty 20

## 2020-10-23 MED ORDER — ROCURONIUM BROMIDE 10 MG/ML (PF) SYRINGE
PREFILLED_SYRINGE | INTRAVENOUS | Status: AC
  Filled 2020-10-23: qty 10

## 2020-10-23 MED ORDER — POTASSIUM CHLORIDE CRYS ER 20 MEQ PO TBCR: 20.0000 meq | EXTENDED_RELEASE_TABLET | Freq: Every day | ORAL | Status: DC | PRN

## 2020-10-23 MED ORDER — OXYCODONE-ACETAMINOPHEN 5-325 MG PO TABS
1.0000 | ORAL_TABLET | ORAL | Status: DC | PRN
Start: 2020-10-23 — End: 2020-10-24
  Administered 2020-10-23: 1 via ORAL
  Administered 2020-10-24: 2 via ORAL
  Filled 2020-10-23 (×2): qty 2

## 2020-10-23 MED ORDER — FENTANYL CITRATE (PF) 250 MCG/5ML IJ SOLN
INTRAMUSCULAR | Status: AC
  Filled 2020-10-23: qty 5

## 2020-10-23 MED ORDER — SODIUM CHLORIDE 0.9 % IV SOLN: INTRAVENOUS | Status: DC

## 2020-10-23 MED ORDER — PHENYLEPHRINE 40 MCG/ML (10ML) SYRINGE FOR IV PUSH (FOR BLOOD PRESSURE SUPPORT)
PREFILLED_SYRINGE | INTRAVENOUS | Status: AC
  Filled 2020-10-23: qty 10

## 2020-10-23 MED ORDER — ASPIRIN EC 81 MG PO TBEC
81.0000 mg | DELAYED_RELEASE_TABLET | Freq: Every day | ORAL | Status: DC
  Administered 2020-10-24: 81 mg via ORAL
  Filled 2020-10-23: qty 1

## 2020-10-23 MED ORDER — PANTOPRAZOLE SODIUM 40 MG PO TBEC
40.0000 mg | DELAYED_RELEASE_TABLET | Freq: Every day | ORAL | Status: DC
  Administered 2020-10-23 – 2020-10-24 (×2): 40 mg via ORAL
  Filled 2020-10-23 (×2): qty 1

## 2020-10-23 MED ORDER — ACETAMINOPHEN 325 MG PO TABS
325.0000 mg | ORAL_TABLET | ORAL | Status: DC | PRN
  Administered 2020-10-24: 650 mg via ORAL
  Filled 2020-10-23: qty 2

## 2020-10-23 MED ORDER — 0.9 % SODIUM CHLORIDE (POUR BTL) OPTIME
TOPICAL | Status: DC | PRN
  Administered 2020-10-23: 2000 mL

## 2020-10-23 MED ORDER — FENTANYL CITRATE (PF) 100 MCG/2ML IJ SOLN
25.0000 ug | INTRAMUSCULAR | Status: DC | PRN
  Administered 2020-10-23: 50 ug via INTRAVENOUS

## 2020-10-23 MED ORDER — LABETALOL HCL 5 MG/ML IV SOLN: 10.0000 mg | INTRAVENOUS | Status: DC | PRN

## 2020-10-23 MED ORDER — CHLORHEXIDINE GLUCONATE 0.12 % MT SOLN
15.0000 mL | Freq: Once | OROMUCOSAL | Status: AC
  Administered 2020-10-23: 15 mL via OROMUCOSAL
  Filled 2020-10-23: qty 15

## 2020-10-23 MED ORDER — APREPITANT 40 MG PO CAPS
40.0000 mg | ORAL_CAPSULE | Freq: Once | ORAL | Status: AC
  Administered 2020-10-23: 40 mg via ORAL
  Filled 2020-10-23: qty 1

## 2020-10-23 MED ORDER — ONDANSETRON HCL 4 MG/2ML IJ SOLN: 4.0000 mg | Freq: Four times a day (QID) | INTRAMUSCULAR | Status: DC | PRN

## 2020-10-23 MED ORDER — ORAL CARE MOUTH RINSE: 15.0000 mL | Freq: Once | OROMUCOSAL | Status: AC

## 2020-10-23 MED ORDER — ONDANSETRON HCL 4 MG/2ML IJ SOLN
INTRAMUSCULAR | Status: DC | PRN
  Administered 2020-10-23: 4 mg via INTRAVENOUS

## 2020-10-23 MED ORDER — LIDOCAINE HCL (PF) 1 % IJ SOLN
INTRAMUSCULAR | Status: AC
  Filled 2020-10-23: qty 30

## 2020-10-23 MED ORDER — SODIUM CHLORIDE 0.9 % IV SOLN
INTRAVENOUS | Status: AC
  Filled 2020-10-23: qty 1.2

## 2020-10-23 MED ORDER — OXYCODONE HCL 5 MG PO TABS: 5.0000 mg | ORAL_TABLET | Freq: Once | ORAL | Status: DC | PRN

## 2020-10-23 MED ORDER — HEPARIN SODIUM (PORCINE) 1000 UNIT/ML IJ SOLN
INTRAMUSCULAR | Status: DC | PRN
  Administered 2020-10-23: 9000 [IU] via INTRAVENOUS

## 2020-10-23 MED ORDER — PHENYLEPHRINE 40 MCG/ML (10ML) SYRINGE FOR IV PUSH (FOR BLOOD PRESSURE SUPPORT)
PREFILLED_SYRINGE | INTRAVENOUS | Status: DC | PRN
  Administered 2020-10-23 (×6): 40 ug via INTRAVENOUS
  Administered 2020-10-23: 80 ug via INTRAVENOUS
  Administered 2020-10-23 (×2): 40 ug via INTRAVENOUS

## 2020-10-23 MED ORDER — METOPROLOL TARTRATE 5 MG/5ML IV SOLN: 2.0000 mg | INTRAVENOUS | Status: DC | PRN

## 2020-10-23 MED ORDER — ROCURONIUM BROMIDE 10 MG/ML (PF) SYRINGE
PREFILLED_SYRINGE | INTRAVENOUS | Status: DC | PRN
  Administered 2020-10-23: 10 mg via INTRAVENOUS
  Administered 2020-10-23: 70 mg via INTRAVENOUS

## 2020-10-23 MED ORDER — PROPOFOL 10 MG/ML IV BOLUS
INTRAVENOUS | Status: DC | PRN
  Administered 2020-10-23: 20 mg via INTRAVENOUS
  Administered 2020-10-23: 120 mg via INTRAVENOUS
  Administered 2020-10-23: 30 mg via INTRAVENOUS
  Administered 2020-10-23: 20 mg via INTRAVENOUS
  Administered 2020-10-23 (×3): 10 mg via INTRAVENOUS

## 2020-10-23 MED ORDER — SODIUM CHLORIDE 0.9 % IV SOLN
INTRAVENOUS | Status: DC | PRN
  Administered 2020-10-23: 500 mL

## 2020-10-23 MED ORDER — DULOXETINE HCL 60 MG PO CPEP
60.0000 mg | ORAL_CAPSULE | Freq: Every day | ORAL | Status: DC
  Administered 2020-10-24: 60 mg via ORAL
  Filled 2020-10-23: qty 1

## 2020-10-23 MED ORDER — SUGAMMADEX SODIUM 200 MG/2ML IV SOLN
INTRAVENOUS | Status: DC | PRN
  Administered 2020-10-23: 200 mg via INTRAVENOUS

## 2020-10-23 MED ORDER — CEFAZOLIN SODIUM-DEXTROSE 2-4 GM/100ML-% IV SOLN
2.0000 g | Freq: Three times a day (TID) | INTRAVENOUS | Status: AC
  Administered 2020-10-23 – 2020-10-24 (×2): 2 g via INTRAVENOUS
  Filled 2020-10-23 (×2): qty 100

## 2020-10-23 MED ORDER — FLUTICASONE PROPIONATE 50 MCG/ACT NA SUSP
1.0000 | Freq: Every day | NASAL | Status: DC | PRN
  Filled 2020-10-23: qty 16

## 2020-10-23 MED ORDER — LIDOCAINE 2% (20 MG/ML) 5 ML SYRINGE
INTRAMUSCULAR | Status: DC | PRN
  Administered 2020-10-23: 100 mg via INTRAVENOUS

## 2020-10-23 MED ORDER — DOCUSATE SODIUM 100 MG PO CAPS
100.0000 mg | ORAL_CAPSULE | Freq: Every day | ORAL | Status: DC
  Administered 2020-10-24: 100 mg via ORAL
  Filled 2020-10-23: qty 1

## 2020-10-23 MED ORDER — ACETAMINOPHEN 650 MG RE SUPP: 325.0000 mg | RECTAL | Status: DC | PRN

## 2020-10-23 MED ORDER — OXYCODONE HCL 5 MG/5ML PO SOLN: 5.0000 mg | Freq: Once | ORAL | Status: DC | PRN

## 2020-10-23 MED ORDER — HYDRALAZINE HCL 20 MG/ML IJ SOLN: 5.0000 mg | INTRAMUSCULAR | Status: DC | PRN

## 2020-10-23 MED ORDER — FENTANYL CITRATE (PF) 250 MCG/5ML IJ SOLN
INTRAMUSCULAR | Status: DC | PRN
  Administered 2020-10-23 (×2): 50 ug via INTRAVENOUS

## 2020-10-23 SURGICAL SUPPLY — 45 items
CANISTER SUCT 3000ML PPV (MISCELLANEOUS) ×2 IMPLANT
CANNULA VESSEL 3MM 2 BLNT TIP (CANNULA) ×4 IMPLANT
CATH ROBINSON RED A/P 18FR (CATHETERS) ×2 IMPLANT
CLIP VESOCCLUDE MED 6/CT (CLIP) ×2 IMPLANT
CLIP VESOCCLUDE SM WIDE 6/CT (CLIP) ×2 IMPLANT
COVER WAND RF STERILE (DRAPES) ×2 IMPLANT
DECANTER SPIKE VIAL GLASS SM (MISCELLANEOUS) IMPLANT
DERMABOND ADVANCED (GAUZE/BANDAGES/DRESSINGS) ×1
DERMABOND ADVANCED .7 DNX12 (GAUZE/BANDAGES/DRESSINGS) ×1 IMPLANT
DRAIN HEMOVAC 1/8 X 5 (WOUND CARE) IMPLANT
ELECT REM PT RETURN 9FT ADLT (ELECTROSURGICAL) ×2
ELECTRODE REM PT RTRN 9FT ADLT (ELECTROSURGICAL) ×1 IMPLANT
EVACUATOR SILICONE 100CC (DRAIN) IMPLANT
GAUZE 4X4 16PLY RFD (DISPOSABLE) ×2 IMPLANT
GLOVE BIO SURGEON STRL SZ7.5 (GLOVE) ×2 IMPLANT
GLOVE SURG POLYISO LF SZ6.5 (GLOVE) ×2 IMPLANT
GLOVE SURG POLYISO LF SZ7.5 (GLOVE) ×4 IMPLANT
GLOVE SURG UNDER POLY LF SZ7 (GLOVE) ×4 IMPLANT
GOWN STRL REUS W/ TWL LRG LVL3 (GOWN DISPOSABLE) ×4 IMPLANT
GOWN STRL REUS W/ TWL XL LVL3 (GOWN DISPOSABLE) ×1 IMPLANT
GOWN STRL REUS W/TWL LRG LVL3 (GOWN DISPOSABLE) ×4
GOWN STRL REUS W/TWL XL LVL3 (GOWN DISPOSABLE) ×1
HEMOSTAT SPONGE AVITENE ULTRA (HEMOSTASIS) IMPLANT
KIT BASIN OR (CUSTOM PROCEDURE TRAY) ×2 IMPLANT
KIT SHUNT ARGYLE CAROTID ART 6 (VASCULAR PRODUCTS) ×2 IMPLANT
KIT TURNOVER KIT B (KITS) ×2 IMPLANT
NEEDLE HYPO 25GX1X1/2 BEV (NEEDLE) IMPLANT
NS IRRIG 1000ML POUR BTL (IV SOLUTION) ×4 IMPLANT
PACK CAROTID (CUSTOM PROCEDURE TRAY) ×2 IMPLANT
PAD ARMBOARD 7.5X6 YLW CONV (MISCELLANEOUS) ×4 IMPLANT
PATCH HEMASHIELD 8X75 (Vascular Products) ×2 IMPLANT
POSITIONER HEAD DONUT 9IN (MISCELLANEOUS) ×2 IMPLANT
SHUNT CAROTID BYPASS 10 (VASCULAR PRODUCTS) IMPLANT
SHUNT CAROTID BYPASS 12FRX15.5 (VASCULAR PRODUCTS) IMPLANT
SUT ETHILON 3 0 PS 1 (SUTURE) IMPLANT
SUT PROLENE 6 0 CC (SUTURE) ×4 IMPLANT
SUT SILK 3 0 TIES 17X18 (SUTURE)
SUT SILK 3-0 18XBRD TIE BLK (SUTURE) IMPLANT
SUT VIC AB 3-0 SH 27 (SUTURE) ×1
SUT VIC AB 3-0 SH 27X BRD (SUTURE) ×1 IMPLANT
SUT VICRYL 4-0 PS2 18IN ABS (SUTURE) ×2 IMPLANT
SYR CONTROL 10ML LL (SYRINGE) IMPLANT
TAPE UMBILICAL COTTON 1/8X30 (MISCELLANEOUS) ×2 IMPLANT
TOWEL GREEN STERILE (TOWEL DISPOSABLE) ×2 IMPLANT
WATER STERILE IRR 1000ML POUR (IV SOLUTION) ×2 IMPLANT

## 2020-10-23 NOTE — Progress Notes (Signed)
  Day of Surgery Note    Subjective:  Sleepy but wakes to voice   Vitals:   10/23/20 0542 10/23/20 1030  BP: (!) 168/75 120/73  Pulse: 95 88  Resp: 18 10  Temp: 98.7 F (37.1 C) 97.6 F (36.4 C)  SpO2: 100% 98%    Incisions:   Clean and dry without hematoma Extremities:  Moving all extremities equally Lungs:  Non labored Neuro:  In tact; tongue is midline   Assessment/Plan:  This is a 65 y.o. male who is s/p  Right CEA  -pt doing well in recovery and neuro in tact -to [place] later today -anticipate dc tomorrow if uneventful evening   Leontine Locket, PA-C 10/23/2020 10:41 AM [phone]

## 2020-10-23 NOTE — Anesthesia Procedure Notes (Signed)
Procedure Name: Intubation Date/Time: 10/23/2020 7:48 AM Performed by: Alain Marion, CRNA Pre-anesthesia Checklist: Patient identified, Emergency Drugs available, Suction available and Patient being monitored Patient Re-evaluated:Patient Re-evaluated prior to induction Oxygen Delivery Method: Circle System Utilized Preoxygenation: Pre-oxygenation with 100% oxygen Induction Type: IV induction Ventilation: Mask ventilation without difficulty and Oral airway inserted - appropriate to patient size Laryngoscope Size: Mac and 4 Grade View: Grade I Tube type: Oral Tube size: 7.5 mm Number of attempts: 1 Airway Equipment and Method: Stylet and Oral airway Placement Confirmation: ETT inserted through vocal cords under direct vision,  positive ETCO2 and breath sounds checked- equal and bilateral Secured at: 23 cm Tube secured with: Tape Dental Injury: Teeth and Oropharynx as per pre-operative assessment

## 2020-10-23 NOTE — Op Note (Signed)
Procedure: Right carotid endarterectomy  Preoperative diagnosis: Asymptomatic greater than 80% right internal carotid artery stenosis  Postoperative diagnosis: Same  Anesthesia: General  Assistant: Arlee Muslim, PA-C to expedite procedure and assist with exposure  Operative findings: 1.  Friable soft plaque greater than 80% stenosis  2.  10 French shunt  3.  Dacron patch  Operative details: After obtaining informed consent the patient was taken the operating.  The patient was placed in supine position operating table.  After induction general anesthesia and endotracheal ovation patient's entire right neck and chest were prepped and draped in usual sterile fashion.  Next a oblique incision was made on the right side of neck just anterior to the border the right sternocleidomastoid muscle.  Incision was carried on through the subcutaneous tissues down the level of the platysma.  The platysma was incised with a full length the incision.  Sternocleidomastoid muscle was identified and reflected laterally.  Internal jugular vein was identified as well as the common facial vein.  The common facial vein was dissected free circumferentially and ligated divided tween silk ties.  This was then reflected laterally.  Common carotid artery was dissected free at the base of the incision.  Patient had a relatively low carotid bifurcation.  Therefore at this point I divided the omohyoid muscle to give further exposure of this.  The vagus nerve was identified and protected.  A branch of the ansa cervicalis was divided.  Dissection was carried up to level external carotid artery and this was dissected free circumferentially and a vessel loop placed around it.  Dissection then proceeded up on the level of the internal carotid artery.  The plaque started just below the carotid bifurcation and extended a few centimeters into the internal carotid artery.  I was able to dissect this free circumferentially just below the  level of the hypoglossal nerve and Vesseloops placed around this.  Patient's mean arterial pressure was raised to greater than 85.  Patient was given 9000 units of intravenous heparin.  The distal internal carotid artery was then controlled with a probable clamp.  The external carotid was controlled with a vessel loop.  Common carotid artery was controlled with a peripheral DeBakeys clamp.  Longitudinal opening was then made in the carotid artery just below the carotid bifurcation.  This was extended longitudinally up to the level of the internal carotid artery.  There was a large amount of soft friable plaque.  Arteriotomy was extended past the level of stenosis.  A 10 French shunt was then brought up in the operative field and threaded into the distal internal carotid artery and allowed to backbleed thoroughly.  There was good pulsatile backbleeding.  The shunt was secured distally with a shunt clamp.  Shunt was then placed proximally into the common carotid artery and secured with a Rummel tourniquet.  The shunt was inspected and found to be free of air.  Flow was then restored to the right side of the brain.  An endarterectomy was begun in a suitable plane in the common carotid artery and carried up into the external carotid artery by eversion technique and a nice feathered distal endpoint was obtained in the internal carotid artery.  All loose debris was then removed.  A dacryon patch was then brought up in the operative field and sewn on as a patch angioplasty using a running 6-0 Prolene suture.  This prior to completion of the anastomosis it was for blood backbled and thoroughly flushed.  The external carotid  artery was thoroughly backbled.  The common carotid was thoroughly forebled.  The internal carotid artery was thoroughly backbled.  Everything was thoroughly irrigated heparinized saline and the patch was completed.  Flow was then restored retrograde from the external carotid artery and then antegrade  from the common carotid artery into the internal carotid artery after approximately 5 cardiac cycles.  Hemostasis was obtained with direct pressure and 90 mg of protamine.  The platysma muscle was reapproximated using a running 3-0 Vicryl suture.  The skin was closed with a 4-0 Vicryl subcuticular stitch.  Dermabond was applied.  The patient tolerated procedure well and there were no complications.  The instrument sponge and needle count was correct the end of the case.  The patient was taken to the recovery room in stable condition.  Ruta Hinds, MD [facility] Office: [phone]

## 2020-10-23 NOTE — Transfer of Care (Signed)
Immediate Anesthesia Transfer of Care Note  Patient: Patrick [name]  Procedure(s) Performed: RIGHT CAROTID ARTERY ENDARTERECTOMY (Right Neck)  Patient Location: PACU  Anesthesia Type:General  Level of Consciousness: awake, alert  and oriented  Airway & Oxygen Therapy: Patient connected to face mask oxygen  Post-op Assessment: Report given to RN  Post vital signs: Reviewed and stable  Last Vitals:  Vitals Value Taken Time  BP 120/73 10/23/20 1030  Temp 36.4 C 10/23/20 1030  Pulse 89 10/23/20 1038  Resp 12 10/23/20 1038  SpO2 94 % 10/23/20 1038  Vitals shown include unvalidated device data.  Last Pain:  Vitals:   10/23/20 1030  TempSrc:   PainSc: 0-No pain         Complications: No complications documented.

## 2020-10-23 NOTE — Anesthesia Postprocedure Evaluation (Signed)
Anesthesia Post Note  Patient: Patrick [name]  Procedure(s) Performed: RIGHT CAROTID ARTERY ENDARTERECTOMY (Right Neck) PATCH ANGIOPLASTY USING HEMASHIELD PLATINUM FINESSE PATCH (Right Neck)     Patient location during evaluation: PACU Anesthesia Type: General Level of consciousness: awake and alert Pain management: pain level controlled Vital Signs Assessment: post-procedure vital signs reviewed and stable Respiratory status: spontaneous breathing, nonlabored ventilation and respiratory function stable Cardiovascular status: blood pressure returned to baseline and stable Postop Assessment: no apparent nausea or vomiting Anesthetic complications: no   No complications documented.  Last Vitals:  Vitals:   10/23/20 1143 10/23/20 1203  BP: 111/61 115/63  Pulse:  83  Resp:  11  Temp:  36.8 C  SpO2: 95% 96%    Last Pain:  Vitals:   10/23/20 1203  TempSrc: Oral  PainSc: [place]

## 2020-10-23 NOTE — Discharge Instructions (Signed)
   [facility]  Discharge Instructions   Carotid Surgery  Please refer to the following instructions for your post-procedure care. Your surgeon or physician assistant will discuss any changes with you.  Activity  You are encouraged to walk as much as you can. You can slowly return to normal activities but must avoid strenuous activity and heavy lifting until your doctor tell you it's okay. Avoid activities such as vacuuming or swinging a golf club. You can drive after one week if you are comfortable and you are no longer taking prescription pain medications. It is normal to feel tired for serval weeks after your surgery. It is also normal to have difficulty with sleep habits, eating, and bowel movements after surgery. These will go away with time.  Bathing/Showering  Shower daily after you go home. Do not soak in a bathtub, hot tub, or swim until the incision heals completely.  Incision Care  Shower every day. Clean your incision with mild soap and water. Pat the area dry with a clean towel. You do not need a bandage unless otherwise instructed. Do not apply any ointments or creams to your incision. You may have skin glue on your incision. Do not peel it off. It will come off on its own in about one week. Your incision may feel thickened and raised for several weeks after your surgery. This is normal and the skin will soften over time.   For Men Only: It's okay to shave around the incision but do not shave the incision itself for 2 weeks. It is common to have numbness under your chin that could last for several months.  Diet  Resume your normal diet. There are no special food restrictions following this procedure. A low fat/low cholesterol diet is recommended for all patients with vascular disease. In order to heal from your surgery, it is CRITICAL to get adequate nutrition. Your body requires vitamins, minerals, and protein. Vegetables are the best source of  vitamins and minerals. Vegetables also provide the perfect balance of protein. Processed food has little nutritional value, so try to avoid this.  Medications  Resume taking all of your medications unless your doctor or physician assistant tells you not to. If your incision is causing pain, you may take over-the- counter pain relievers such as acetaminophen (Tylenol). If you were prescribed a stronger pain medication, please be aware these medications can cause nausea and constipation. Prevent nausea by taking the medication with a snack or meal. Avoid constipation by drinking plenty of fluids and eating foods with a high amount of fiber, such as fruits, vegetables, and grains.   Do not take Tylenol if you are taking prescription pain medications.  Follow Up  Our office will schedule a follow up appointment 2-3 weeks following discharge.  Please call us immediately for any of the following conditions  . Increased pain, redness, drainage (pus) from your incision site. . Fever of 101 degrees or higher. . If you should develop stroke (slurred speech, difficulty swallowing, weakness on one side of your body, loss of vision) you should call 911 and go to the nearest emergency room. .  Reduce your risk of vascular disease:  . Stop smoking. If you would like help call [facility] at 1-800-QUIT-NOW (1-800-784-8669) or [place] at 336-586-4000. . Manage your cholesterol . Maintain a desired weight . Control your diabetes . Keep your blood pressure down .  If you have any questions, please call the office at [phone]. 

## 2020-10-23 NOTE — Anesthesia Preprocedure Evaluation (Addendum)
Anesthesia Evaluation  Patient identified by MRN, date of birth, ID band Patient awake    Reviewed: Allergy & Precautions, NPO status , Patient's Chart, lab work & pertinent test results  History of Anesthesia Complications Negative for: history of anesthetic complications  Airway Mallampati: II  TM Distance: >3 FB Neck ROM: Full    Dental  (+) Dental Advisory Given, Teeth Intact   Pulmonary former smoker,    Pulmonary exam normal        Cardiovascular hypertension, Pt. on medications + Peripheral Vascular Disease  Normal cardiovascular exam   '22 Carotid US - 80-99% right ICAS, 1-39% left ICAS    Neuro/Psych  Headaches, PSYCHIATRIC DISORDERS Dementia  Hx TBI with consequential memory issues  TIA   GI/Hepatic Neg liver ROS, GERD  Medicated and Controlled,  Endo/Other  negative endocrine ROS  Renal/GU negative Renal ROS     Musculoskeletal  (+) Arthritis ,   Abdominal   Peds  Hematology  On plavix    Anesthesia Other Findings Covid test negative   Reproductive/Obstetrics                            Anesthesia Physical Anesthesia Plan  ASA: III  Anesthesia Plan: General   Post-op Pain Management:    Induction: Intravenous  PONV Risk Score and Plan: 2 and Treatment may vary due to age or medical condition, Ondansetron, Dexamethasone and Aprepitant  Airway Management Planned: Oral ETT  Additional Equipment: Arterial line  Intra-op Plan:   Post-operative Plan: Extubation in OR  Informed Consent: I have reviewed the patients History and Physical, chart, labs and discussed the procedure including the risks, benefits and alternatives for the proposed anesthesia with the patient or authorized representative who has indicated his/her understanding and acceptance.     Dental advisory given  Plan Discussed with: CRNA and Anesthesiologist  Anesthesia Plan Comments:         Anesthesia Quick Evaluation

## 2020-10-23 NOTE — Anesthesia Procedure Notes (Addendum)
Arterial Line Insertion Start/End4/19/2022 7:48 AM, 10/23/2020 7:53 AM Performed by: Audry Pili, MD, anesthesiologist  Patient location: Pre-op. Preanesthetic checklist: patient identified, IV checked, risks and benefits discussed, surgical consent, monitors and equipment checked, pre-op evaluation, timeout performed and anesthesia consent Lidocaine 1% used for infiltration and patient sedated Left, radial was placed Catheter size: 20 G Hand hygiene performed   Attempts: 4 (Multiple attempts by CRNA on right radial artery unsuccessful) Procedure performed using ultrasound guided technique. Ultrasound Notes:anatomy identified, needle tip was noted to be adjacent to the nerve/plexus identified, no ultrasound evidence of intravascular and/or intraneural injection and image(s) printed for medical record Following insertion, dressing applied and Biopatch. Post procedure assessment: unchanged and normal  Patient tolerated the procedure well with no immediate complications.

## 2020-10-23 NOTE — Progress Notes (Signed)
Pt arrived from PACU, wife bedside. VSS, neuro intact, pt and family oriented to room and unit, will continue to monitor.  Chrisandra Carota, RN 10/23/2020 12:06 PM

## 2020-10-23 NOTE — Interval H&P Note (Signed)
History and Physical Interval Note:  10/23/2020 7:18 AM  Patrick [name]  has presented today for surgery, with the diagnosis of Carotid Artery Stenosis.  The various methods of treatment have been discussed with the patient and family. After consideration of risks, benefits and other options for treatment, the patient has consented to  Procedure(s): RIGHT CAROTID ARTERY ENDARTERECTOMY (Right) as a surgical intervention.  The patient's history has been reviewed, patient examined, no change in status, stable for surgery.  I have reviewed the patient's chart and labs.  Questions were answered to the patient's satisfaction.     Ruta Hinds

## 2020-10-24 ENCOUNTER — Encounter (HOSPITAL_COMMUNITY): Payer: Self-pay | Admitting: Vascular Surgery

## 2020-10-24 LAB — BASIC METABOLIC PANEL
Anion gap: 7 (ref 5–15)
BUN: 16 mg/dL (ref 8–23)
CO2: 22 mmol/L (ref 22–32)
Calcium: 8.3 mg/dL — ABNORMAL LOW (ref 8.9–10.3)
Chloride: 111 mmol/L (ref 98–111)
Creatinine, Ser: 1.1 mg/dL (ref 0.61–1.24)
GFR, Estimated: >60 mL/min (ref >60)
Glucose, Bld: 127 mg/dL — ABNORMAL HIGH (ref 70–99)
Potassium: 3.7 mmol/L (ref 3.5–5.1)
Sodium: 140 mmol/L (ref 135–145)

## 2020-10-24 LAB — CBC
HCT: 33.9 % — ABNORMAL LOW (ref 39.0–52.0)
Hemoglobin: 11.8 g/dL — ABNORMAL LOW (ref 13.0–17.0)
MCH: 33 pg (ref 26.0–34.0)
MCHC: 34.8 g/dL (ref 30.0–36.0)
MCV: 94.7 fL (ref 80.0–100.0)
Platelets: 147 10*3/uL — ABNORMAL LOW (ref 150–400)
RBC: 3.58 MIL/uL — ABNORMAL LOW (ref 4.22–5.81)
RDW: 12.2 % (ref 11.5–15.5)
WBC: 10.1 10*3/uL (ref 4.0–10.5)
nRBC: 0 % (ref 0.0–0.2)

## 2020-10-24 MED ORDER — OXYCODONE-ACETAMINOPHEN 5-325 MG PO TABS: 1.0000 | ORAL_TABLET | Freq: Four times a day (QID) | ORAL | 0 refills | Status: AC | PRN

## 2020-10-24 NOTE — Progress Notes (Signed)
[facility] [place]  Subjective  - feels ok some right neck drainage earlier   Objective 117/62 65 97.6 F (36.4 C) (Oral) 17 96%  Intake/Output Summary (Last 24 hours) at 10/24/2020 0820 Last data filed at 10/24/2020 0300 Gross per 24 hour  Intake 2695.14 ml  Output 750 ml  Net 1945.14 ml   Right neck no hematoma or drainage Neuro UE LE motor 5/5  No facial asymmetry Tongue midline  Assessment/Planning: Doing well s/p right CEA D/c home  Ruta Hinds 10/24/2020 8:20 AM --  Laboratory Lab Results: Recent Labs    10/22/20 1600 10/24/20 0500  WBC 5.6 10.1  HGB 15.7 11.8*  HCT 45.4 33.9*  PLT 206 147*   BMET Recent Labs    10/22/20 1600 10/24/20 0500  NA 140 140  K 3.2* 3.7  CL 103 111  CO2 27 22  GLUCOSE 110* 127*  BUN 19 16  CREATININE 1.24 1.10  CALCIUM 9.6 8.3*    COAG Lab Results  Component Value Date   INR 1.0 10/22/2020   No results found for: PTT
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
## 2020-11-08 ENCOUNTER — Ambulatory Visit (INDEPENDENT_AMBULATORY_CARE_PROVIDER_SITE_OTHER): Payer: Medicare Other | Admitting: Physician Assistant

## 2020-11-08 ENCOUNTER — Other Ambulatory Visit: Payer: Self-pay

## 2020-11-08 VITALS — BP 138/72 | HR 79 | Temp 97.9°F | Resp 20 | Ht 69.0 in | Wt 179.8 lb

## 2020-11-08 DIAGNOSIS — I6522 Occlusion and stenosis of left carotid artery: Secondary | ICD-10-CM
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
## 2020-11-09 ENCOUNTER — Other Ambulatory Visit: Payer: Self-pay

## 2020-11-09 DIAGNOSIS — I6522 Occlusion and stenosis of left carotid artery: Secondary | ICD-10-CM

## 2020-11-23 DIAGNOSIS — G459 Transient cerebral ischemic attack, unspecified: Secondary | ICD-10-CM | POA: Diagnosis not present

## 2020-11-23 DIAGNOSIS — N183 Chronic kidney disease, stage 3 unspecified: Secondary | ICD-10-CM | POA: Diagnosis not present

## 2020-11-23 DIAGNOSIS — E786 Lipoprotein deficiency: Secondary | ICD-10-CM | POA: Diagnosis not present

## 2020-11-23 DIAGNOSIS — E876 Hypokalemia: Secondary | ICD-10-CM | POA: Diagnosis not present

## 2020-11-23 DIAGNOSIS — I779 Disorder of arteries and arterioles, unspecified: Secondary | ICD-10-CM | POA: Diagnosis not present

## 2020-11-26 ENCOUNTER — Telehealth: Payer: Self-pay

## 2020-11-26 NOTE — Telephone Encounter (Signed)
Pt s/p R CEA with bumps at "skin level" that have "popped" and had "pus" drain from them. He was working on a car and straining. Area has some tenderness to it but otherwise pt feels fine.  Spoke to pt's wife with pt present and they are at the beach and prefer to stay and f/u next week when they are back. Pt has been scheduled to see APP this week and they will call office tomorrow to keep or cancel appt. Depending on how area looks.

## 2020-11-28 ENCOUNTER — Ambulatory Visit: Payer: Medicare Other

## 2020-12-10 ENCOUNTER — Encounter: Payer: Self-pay | Admitting: Vascular Surgery

## 2020-12-12 ENCOUNTER — Telehealth: Payer: Self-pay

## 2020-12-12 NOTE — Telephone Encounter (Signed)
Patient's wife called to report that patient still has the nodule at the bottom of his carotid incision and it is red and inflamed. Sounds like it was draining, but not anymore. "It's like a pimple." Denies fever and chills. Issue has been going on for a few weeks. They were on vacation and opted not to leave for treatment. Since then, patient's brother has died and funeral is tomorrow. They would like to be seen sooner rather than later. Added on to CEF schedule for evaluation tomorrow morning.

## 2020-12-13 ENCOUNTER — Encounter: Payer: Self-pay | Admitting: Vascular Surgery

## 2020-12-13 ENCOUNTER — Ambulatory Visit (INDEPENDENT_AMBULATORY_CARE_PROVIDER_SITE_OTHER): Payer: Medicare Other | Admitting: Vascular Surgery

## 2020-12-13 ENCOUNTER — Other Ambulatory Visit: Payer: Self-pay

## 2020-12-13 VITALS — BP 138/76 | HR 68 | Temp 98.1°F | Resp 20 | Ht 69.0 in | Wt 179.0 lb

## 2020-12-13 DIAGNOSIS — I6521 Occlusion and stenosis of right carotid artery: Secondary | ICD-10-CM

## 2020-12-13 MED ORDER — CEPHALEXIN 500 MG PO CAPS: 500.0000 mg | ORAL_CAPSULE | Freq: Two times a day (BID) | ORAL | 0 refills | Status: AC

## 2020-12-13 NOTE — Progress Notes (Signed)
Patient is status post right carotid endarterectomy October 23, 2020.  He has noted 2 areas of inflammation 1 at the superior and 1 at the inferior pole of the right neck incision.  There is a small pustule in this location.  There is no mass in the right neck or significant erythema.  He has not had a fever.  Physical exam:  Vitals:   12/13/20 0841 12/13/20 0843  BP: 133/81 138/76  Pulse: 68   Resp: 20   Temp: 98.1 F (36.7 C)   SpO2: 97%   Weight: 179 lb (81.2 kg)   Height: 5\' 9"  (1.753 m)     Right neck incision healing well 2 areas of inflammation 1 at the superior pole one of the inferior pole each less than a millimeter diameter.  I was able to probe both of these and find a small amount of Vicryl suture material.  This was removed.  No purulent drainage was encountered there was a small amount of bloody drainage.  Assessment: Right neck incision suture reaction inflammation, low suspicion for deep infection at this point.  Plan: Patient was placed on Keflex for 10-day course today 500 mg twice a day.  He will wash the wound once daily with soap and water.  He will follow-up in 2 weeks time to see if this is completely resolved.  Ruta Hinds, MD [facility] [place] Office: [phone]

## 2020-12-27 ENCOUNTER — Ambulatory Visit (INDEPENDENT_AMBULATORY_CARE_PROVIDER_SITE_OTHER): Payer: Medicare Other | Admitting: Vascular Surgery

## 2020-12-27 ENCOUNTER — Encounter: Payer: Self-pay | Admitting: Vascular Surgery

## 2020-12-27 ENCOUNTER — Other Ambulatory Visit: Payer: Self-pay

## 2020-12-27 VITALS — BP 114/71 | HR 76 | Temp 98.0°F | Resp 20 | Ht 69.0 in | Wt 177.0 lb

## 2020-12-27 DIAGNOSIS — I6521 Occlusion and stenosis of right carotid artery: Secondary | ICD-10-CM

## 2020-12-27 NOTE — Progress Notes (Signed)
Patient is a 65 year old male who returns for follow-up today.  He had a right carotid endarterectomy October 23, 2020.  He was last seen on December 13, 2020 with some areas of inflammation in the incision which appeared to be suture reaction.  He returns today for further follow-up.  He has completed a course of Keflex.  He has had no further drainage.  He feels like the neck incision is much improved.  Physical exam:  Vitals:   12/27/20 1519  BP: 114/71  Pulse: 76  Resp: 20  Temp: 98 F (36.7 C)  SpO2: 96%  Weight: 177 lb (80.3 kg)  Height: 5\' 9"  (1.753 m)    Right neck healing incision 1 small nodule at the base of the incision which does not appear inflamed and probably still represents some suture granuloma.  Assessment: Healing right neck incision status post right carotid endarterectomy.  Plan: Patient will follow up in 9 months with a carotid duplex exam in APP clinic.  If he has any redness or drainage occur in his right neck prior to this he will come back to see me sooner.  Ruta Hinds, MD [facility] [place] Office: [phone]

## 2020-12-28 ENCOUNTER — Other Ambulatory Visit: Payer: Self-pay

## 2020-12-28 DIAGNOSIS — I6521 Occlusion and stenosis of right carotid artery: Secondary | ICD-10-CM

## 2021-01-31 DIAGNOSIS — R1032 Left lower quadrant pain: Secondary | ICD-10-CM | POA: Diagnosis not present

## 2021-01-31 DIAGNOSIS — R9431 Abnormal electrocardiogram [ECG] [EKG]: Secondary | ICD-10-CM | POA: Diagnosis not present

## 2021-01-31 DIAGNOSIS — Z79899 Other long term (current) drug therapy: Secondary | ICD-10-CM | POA: Diagnosis not present

## 2021-01-31 DIAGNOSIS — R55 Syncope and collapse: Secondary | ICD-10-CM | POA: Diagnosis not present

## 2021-01-31 DIAGNOSIS — R1030 Lower abdominal pain, unspecified: Secondary | ICD-10-CM | POA: Diagnosis not present

## 2021-01-31 DIAGNOSIS — R109 Unspecified abdominal pain: Secondary | ICD-10-CM | POA: Diagnosis not present

## 2021-01-31 DIAGNOSIS — R6889 Other general symptoms and signs: Secondary | ICD-10-CM | POA: Diagnosis not present

## 2021-01-31 DIAGNOSIS — Z743 Need for continuous supervision: Secondary | ICD-10-CM | POA: Diagnosis not present

## 2021-01-31 DIAGNOSIS — R0689 Other abnormalities of breathing: Secondary | ICD-10-CM | POA: Diagnosis not present

## 2021-01-31 DIAGNOSIS — R001 Bradycardia, unspecified: Secondary | ICD-10-CM | POA: Diagnosis not present

## 2021-01-31 DIAGNOSIS — I499 Cardiac arrhythmia, unspecified: Secondary | ICD-10-CM | POA: Diagnosis not present

## 2021-01-31 DIAGNOSIS — W19XXXA Unspecified fall, initial encounter: Secondary | ICD-10-CM | POA: Diagnosis not present

## 2021-02-04 DIAGNOSIS — R739 Hyperglycemia, unspecified: Secondary | ICD-10-CM | POA: Diagnosis not present

## 2021-02-04 DIAGNOSIS — R5383 Other fatigue: Secondary | ICD-10-CM | POA: Diagnosis not present

## 2021-02-04 DIAGNOSIS — N183 Chronic kidney disease, stage 3 unspecified: Secondary | ICD-10-CM | POA: Diagnosis not present

## 2021-02-04 DIAGNOSIS — E7849 Other hyperlipidemia: Secondary | ICD-10-CM | POA: Diagnosis not present

## 2021-02-04 DIAGNOSIS — E782 Mixed hyperlipidemia: Secondary | ICD-10-CM | POA: Diagnosis not present

## 2021-02-04 DIAGNOSIS — E876 Hypokalemia: Secondary | ICD-10-CM | POA: Diagnosis not present

## 2021-02-05 DIAGNOSIS — Z8249 Family history of ischemic heart disease and other diseases of the circulatory system: Secondary | ICD-10-CM | POA: Diagnosis not present

## 2021-02-05 DIAGNOSIS — I10 Essential (primary) hypertension: Secondary | ICD-10-CM | POA: Diagnosis not present

## 2021-02-05 DIAGNOSIS — R55 Syncope and collapse: Secondary | ICD-10-CM | POA: Diagnosis not present

## 2021-02-05 DIAGNOSIS — E876 Hypokalemia: Secondary | ICD-10-CM | POA: Diagnosis not present

## 2021-02-05 DIAGNOSIS — E782 Mixed hyperlipidemia: Secondary | ICD-10-CM | POA: Diagnosis not present

## 2021-02-05 DIAGNOSIS — I779 Disorder of arteries and arterioles, unspecified: Secondary | ICD-10-CM | POA: Diagnosis not present

## 2021-02-05 DIAGNOSIS — R519 Headache, unspecified: Secondary | ICD-10-CM | POA: Diagnosis not present

## 2021-02-22 DIAGNOSIS — N183 Chronic kidney disease, stage 3 unspecified: Secondary | ICD-10-CM | POA: Diagnosis not present

## 2021-02-22 DIAGNOSIS — I10 Essential (primary) hypertension: Secondary | ICD-10-CM | POA: Diagnosis not present

## 2021-02-22 DIAGNOSIS — E782 Mixed hyperlipidemia: Secondary | ICD-10-CM | POA: Diagnosis not present

## 2021-02-22 DIAGNOSIS — I779 Disorder of arteries and arterioles, unspecified: Secondary | ICD-10-CM | POA: Diagnosis not present

## 2021-02-22 DIAGNOSIS — Z0001 Encounter for general adult medical examination with abnormal findings: Secondary | ICD-10-CM | POA: Diagnosis not present

## 2021-02-22 DIAGNOSIS — Z1159 Encounter for screening for other viral diseases: Secondary | ICD-10-CM | POA: Diagnosis not present

## 2021-02-22 DIAGNOSIS — F17210 Nicotine dependence, cigarettes, uncomplicated: Secondary | ICD-10-CM | POA: Diagnosis not present

## 2021-02-22 DIAGNOSIS — E7849 Other hyperlipidemia: Secondary | ICD-10-CM | POA: Diagnosis not present

## 2021-02-28 DIAGNOSIS — R42 Dizziness and giddiness: Secondary | ICD-10-CM | POA: Diagnosis not present

## 2021-02-28 DIAGNOSIS — E876 Hypokalemia: Secondary | ICD-10-CM | POA: Diagnosis not present

## 2021-02-28 DIAGNOSIS — E7849 Other hyperlipidemia: Secondary | ICD-10-CM | POA: Diagnosis not present

## 2021-02-28 DIAGNOSIS — I10 Essential (primary) hypertension: Secondary | ICD-10-CM | POA: Diagnosis not present

## 2021-02-28 DIAGNOSIS — I6522 Occlusion and stenosis of left carotid artery: Secondary | ICD-10-CM | POA: Diagnosis not present

## 2021-02-28 DIAGNOSIS — R4781 Slurred speech: Secondary | ICD-10-CM | POA: Diagnosis not present

## 2021-02-28 DIAGNOSIS — G44329 Chronic post-traumatic headache, not intractable: Secondary | ICD-10-CM | POA: Diagnosis not present

## 2021-06-21 DIAGNOSIS — I10 Essential (primary) hypertension: Secondary | ICD-10-CM | POA: Diagnosis not present

## 2021-06-21 DIAGNOSIS — E782 Mixed hyperlipidemia: Secondary | ICD-10-CM | POA: Diagnosis not present

## 2021-06-21 DIAGNOSIS — N183 Chronic kidney disease, stage 3 unspecified: Secondary | ICD-10-CM | POA: Diagnosis not present

## 2021-06-21 DIAGNOSIS — R5383 Other fatigue: Secondary | ICD-10-CM | POA: Diagnosis not present

## 2021-06-21 DIAGNOSIS — E7849 Other hyperlipidemia: Secondary | ICD-10-CM | POA: Diagnosis not present

## 2021-06-21 DIAGNOSIS — R739 Hyperglycemia, unspecified: Secondary | ICD-10-CM | POA: Diagnosis not present

## 2021-06-21 DIAGNOSIS — E876 Hypokalemia: Secondary | ICD-10-CM | POA: Diagnosis not present

## 2021-06-26 DIAGNOSIS — E876 Hypokalemia: Secondary | ICD-10-CM | POA: Diagnosis not present

## 2021-06-26 DIAGNOSIS — G44309 Post-traumatic headache, unspecified, not intractable: Secondary | ICD-10-CM | POA: Diagnosis not present

## 2021-06-26 DIAGNOSIS — R42 Dizziness and giddiness: Secondary | ICD-10-CM | POA: Diagnosis not present

## 2021-06-26 DIAGNOSIS — I6522 Occlusion and stenosis of left carotid artery: Secondary | ICD-10-CM | POA: Diagnosis not present

## 2021-06-26 DIAGNOSIS — Z23 Encounter for immunization: Secondary | ICD-10-CM | POA: Diagnosis not present

## 2021-06-26 DIAGNOSIS — I10 Essential (primary) hypertension: Secondary | ICD-10-CM | POA: Diagnosis not present

## 2021-06-26 DIAGNOSIS — R4781 Slurred speech: Secondary | ICD-10-CM | POA: Diagnosis not present

## 2021-07-12 DIAGNOSIS — I779 Disorder of arteries and arterioles, unspecified: Secondary | ICD-10-CM | POA: Diagnosis not present

## 2021-07-12 DIAGNOSIS — R55 Syncope and collapse: Secondary | ICD-10-CM | POA: Diagnosis not present

## 2021-07-12 DIAGNOSIS — I10 Essential (primary) hypertension: Secondary | ICD-10-CM | POA: Diagnosis not present

## 2021-07-18 DIAGNOSIS — I10 Essential (primary) hypertension: Secondary | ICD-10-CM | POA: Diagnosis not present

## 2021-07-18 DIAGNOSIS — R55 Syncope and collapse: Secondary | ICD-10-CM | POA: Diagnosis not present

## 2021-07-26 DIAGNOSIS — R9439 Abnormal result of other cardiovascular function study: Secondary | ICD-10-CM | POA: Diagnosis not present

## 2021-07-26 DIAGNOSIS — Z8679 Personal history of other diseases of the circulatory system: Secondary | ICD-10-CM | POA: Diagnosis not present

## 2021-07-26 DIAGNOSIS — I2510 Atherosclerotic heart disease of native coronary artery without angina pectoris: Secondary | ICD-10-CM | POA: Diagnosis not present

## 2021-07-26 DIAGNOSIS — I779 Disorder of arteries and arterioles, unspecified: Secondary | ICD-10-CM | POA: Diagnosis not present

## 2021-07-26 DIAGNOSIS — R030 Elevated blood-pressure reading, without diagnosis of hypertension: Secondary | ICD-10-CM | POA: Diagnosis not present

## 2021-07-26 DIAGNOSIS — Z9889 Other specified postprocedural states: Secondary | ICD-10-CM | POA: Diagnosis not present

## 2021-07-26 DIAGNOSIS — R55 Syncope and collapse: Secondary | ICD-10-CM | POA: Diagnosis not present

## 2021-07-26 DIAGNOSIS — I10 Essential (primary) hypertension: Secondary | ICD-10-CM | POA: Diagnosis not present

## 2021-09-06 DIAGNOSIS — J019 Acute sinusitis, unspecified: Secondary | ICD-10-CM | POA: Diagnosis not present

## 2021-09-06 DIAGNOSIS — R070 Pain in throat: Secondary | ICD-10-CM | POA: Diagnosis not present

## 2021-09-06 DIAGNOSIS — J029 Acute pharyngitis, unspecified: Secondary | ICD-10-CM | POA: Diagnosis not present

## 2021-12-17 DIAGNOSIS — M5412 Radiculopathy, cervical region: Secondary | ICD-10-CM | POA: Diagnosis not present

## 2021-12-19 DIAGNOSIS — E7849 Other hyperlipidemia: Secondary | ICD-10-CM | POA: Diagnosis not present

## 2021-12-19 DIAGNOSIS — R5383 Other fatigue: Secondary | ICD-10-CM | POA: Diagnosis not present

## 2021-12-19 DIAGNOSIS — E782 Mixed hyperlipidemia: Secondary | ICD-10-CM | POA: Diagnosis not present

## 2021-12-19 DIAGNOSIS — N183 Chronic kidney disease, stage 3 unspecified: Secondary | ICD-10-CM | POA: Diagnosis not present

## 2021-12-19 DIAGNOSIS — R7301 Impaired fasting glucose: Secondary | ICD-10-CM | POA: Diagnosis not present

## 2021-12-19 DIAGNOSIS — E876 Hypokalemia: Secondary | ICD-10-CM | POA: Diagnosis not present

## 2021-12-19 DIAGNOSIS — I10 Essential (primary) hypertension: Secondary | ICD-10-CM | POA: Diagnosis not present

## 2021-12-25 DIAGNOSIS — R42 Dizziness and giddiness: Secondary | ICD-10-CM | POA: Diagnosis not present

## 2021-12-25 DIAGNOSIS — G44329 Chronic post-traumatic headache, not intractable: Secondary | ICD-10-CM | POA: Diagnosis not present

## 2021-12-25 DIAGNOSIS — E876 Hypokalemia: Secondary | ICD-10-CM | POA: Diagnosis not present

## 2021-12-25 DIAGNOSIS — N189 Chronic kidney disease, unspecified: Secondary | ICD-10-CM | POA: Diagnosis not present

## 2021-12-25 DIAGNOSIS — G44309 Post-traumatic headache, unspecified, not intractable: Secondary | ICD-10-CM | POA: Diagnosis not present

## 2021-12-25 DIAGNOSIS — E7849 Other hyperlipidemia: Secondary | ICD-10-CM | POA: Diagnosis not present

## 2021-12-25 DIAGNOSIS — Z23 Encounter for immunization: Secondary | ICD-10-CM | POA: Diagnosis not present

## 2021-12-25 DIAGNOSIS — R7301 Impaired fasting glucose: Secondary | ICD-10-CM | POA: Diagnosis not present

## 2021-12-25 DIAGNOSIS — R4781 Slurred speech: Secondary | ICD-10-CM | POA: Diagnosis not present

## 2021-12-25 DIAGNOSIS — I10 Essential (primary) hypertension: Secondary | ICD-10-CM | POA: Diagnosis not present

## 2021-12-25 DIAGNOSIS — I6522 Occlusion and stenosis of left carotid artery: Secondary | ICD-10-CM | POA: Diagnosis not present

## 2022-05-09 DIAGNOSIS — R509 Fever, unspecified: Secondary | ICD-10-CM | POA: Diagnosis not present

## 2022-05-09 DIAGNOSIS — H6693 Otitis media, unspecified, bilateral: Secondary | ICD-10-CM | POA: Diagnosis not present

## 2022-05-09 DIAGNOSIS — J029 Acute pharyngitis, unspecified: Secondary | ICD-10-CM | POA: Diagnosis not present

## 2022-05-09 DIAGNOSIS — J019 Acute sinusitis, unspecified: Secondary | ICD-10-CM | POA: Diagnosis not present

## 2022-06-10 DIAGNOSIS — N183 Chronic kidney disease, stage 3 unspecified: Secondary | ICD-10-CM | POA: Diagnosis not present

## 2022-06-10 DIAGNOSIS — R5383 Other fatigue: Secondary | ICD-10-CM | POA: Diagnosis not present

## 2022-06-10 DIAGNOSIS — E7849 Other hyperlipidemia: Secondary | ICD-10-CM | POA: Diagnosis not present

## 2022-06-10 DIAGNOSIS — I10 Essential (primary) hypertension: Secondary | ICD-10-CM | POA: Diagnosis not present

## 2022-06-10 DIAGNOSIS — R7301 Impaired fasting glucose: Secondary | ICD-10-CM | POA: Diagnosis not present

## 2022-06-10 DIAGNOSIS — E876 Hypokalemia: Secondary | ICD-10-CM | POA: Diagnosis not present

## 2022-06-16 DIAGNOSIS — E7849 Other hyperlipidemia: Secondary | ICD-10-CM | POA: Diagnosis not present

## 2022-06-16 DIAGNOSIS — E876 Hypokalemia: Secondary | ICD-10-CM | POA: Diagnosis not present

## 2022-06-16 DIAGNOSIS — I6522 Occlusion and stenosis of left carotid artery: Secondary | ICD-10-CM | POA: Diagnosis not present

## 2022-06-16 DIAGNOSIS — R7301 Impaired fasting glucose: Secondary | ICD-10-CM | POA: Diagnosis not present

## 2022-06-16 DIAGNOSIS — R7303 Prediabetes: Secondary | ICD-10-CM | POA: Diagnosis not present

## 2022-06-16 DIAGNOSIS — R4781 Slurred speech: Secondary | ICD-10-CM | POA: Diagnosis not present

## 2022-06-16 DIAGNOSIS — I10 Essential (primary) hypertension: Secondary | ICD-10-CM | POA: Diagnosis not present

## 2022-06-16 DIAGNOSIS — Z23 Encounter for immunization: Secondary | ICD-10-CM | POA: Diagnosis not present

## 2022-06-16 DIAGNOSIS — G44309 Post-traumatic headache, unspecified, not intractable: Secondary | ICD-10-CM | POA: Diagnosis not present

## 2022-06-16 DIAGNOSIS — R5383 Other fatigue: Secondary | ICD-10-CM | POA: Diagnosis not present

## 2022-06-16 DIAGNOSIS — R42 Dizziness and giddiness: Secondary | ICD-10-CM | POA: Diagnosis not present

## 2022-10-05 DIAGNOSIS — E782 Mixed hyperlipidemia: Secondary | ICD-10-CM | POA: Diagnosis not present

## 2022-10-05 DIAGNOSIS — E1165 Type 2 diabetes mellitus with hyperglycemia: Secondary | ICD-10-CM | POA: Diagnosis not present

## 2022-10-17 DIAGNOSIS — J209 Acute bronchitis, unspecified: Secondary | ICD-10-CM | POA: Diagnosis not present

## 2022-10-17 DIAGNOSIS — J309 Allergic rhinitis, unspecified: Secondary | ICD-10-CM | POA: Diagnosis not present

## 2022-10-23 DIAGNOSIS — J209 Acute bronchitis, unspecified: Secondary | ICD-10-CM | POA: Diagnosis not present

## 2022-10-26 ENCOUNTER — Other Ambulatory Visit: Payer: Self-pay

## 2022-10-26 ENCOUNTER — Emergency Department (HOSPITAL_COMMUNITY): Payer: Medicare Other

## 2022-10-26 ENCOUNTER — Emergency Department (HOSPITAL_COMMUNITY)
Admission: EM | Admit: 2022-10-26 | Discharge: 2022-10-26 | Disposition: A | Discharge status: Home / Self Care | Payer: Medicare Other | Attending: Emergency Medicine | Admitting: Emergency Medicine

## 2022-10-26 ENCOUNTER — Encounter (HOSPITAL_COMMUNITY): Payer: Self-pay

## 2022-10-26 DIAGNOSIS — I10 Essential (primary) hypertension: Secondary | ICD-10-CM | POA: Diagnosis not present

## 2022-10-26 DIAGNOSIS — Z7902 Long term (current) use of antithrombotics/antiplatelets: Secondary | ICD-10-CM | POA: Insufficient documentation

## 2022-10-26 DIAGNOSIS — R0602 Shortness of breath: Secondary | ICD-10-CM | POA: Insufficient documentation

## 2022-10-26 DIAGNOSIS — Z20822 Contact with and (suspected) exposure to covid-19: Secondary | ICD-10-CM | POA: Insufficient documentation

## 2022-10-26 DIAGNOSIS — R079 Chest pain, unspecified: Secondary | ICD-10-CM | POA: Diagnosis not present

## 2022-10-26 DIAGNOSIS — Z7982 Long term (current) use of aspirin: Secondary | ICD-10-CM | POA: Diagnosis not present

## 2022-10-26 DIAGNOSIS — J40 Bronchitis, not specified as acute or chronic: Secondary | ICD-10-CM | POA: Diagnosis not present

## 2022-10-26 DIAGNOSIS — R051 Acute cough: Secondary | ICD-10-CM

## 2022-10-26 DIAGNOSIS — R059 Cough, unspecified: Secondary | ICD-10-CM | POA: Diagnosis not present

## 2022-10-26 DIAGNOSIS — A419 Sepsis, unspecified organism: Secondary | ICD-10-CM | POA: Diagnosis not present

## 2022-10-26 LAB — URINALYSIS, ROUTINE W REFLEX MICROSCOPIC
Bacteria, UA: NONE SEEN
Bilirubin Urine: NEGATIVE
Glucose, UA: NEGATIVE mg/dL
Hgb urine dipstick: NEGATIVE
Ketones, ur: NEGATIVE mg/dL
Leukocytes,Ua: NEGATIVE
Nitrite: NEGATIVE
Protein, ur: 30 mg/dL — AB
Specific Gravity, Urine: 1.026 (ref 1.005–1.030)
pH: 5 (ref 5.0–8.0)

## 2022-10-26 LAB — CBC WITH DIFFERENTIAL/PLATELET
Abs Immature Granulocytes: 0.06 10*3/uL (ref 0.00–0.07)
Basophils Absolute: 0 10*3/uL (ref 0.0–0.1)
Basophils Relative: 0 %
Eosinophils Absolute: 0.2 10*3/uL (ref 0.0–0.5)
Eosinophils Relative: 2 %
HCT: 49.6 % (ref 39.0–52.0)
Hemoglobin: 16.7 g/dL (ref 13.0–17.0)
Immature Granulocytes: 1 %
Lymphocytes Relative: 18 %
Lymphs Abs: 1.7 10*3/uL (ref 0.7–4.0)
MCH: 32.1 pg (ref 26.0–34.0)
MCHC: 33.7 g/dL (ref 30.0–36.0)
MCV: 95.2 fL (ref 80.0–100.0)
Monocytes Absolute: 0.7 10*3/uL (ref 0.1–1.0)
Monocytes Relative: 7 %
Neutro Abs: 6.8 10*3/uL (ref 1.7–7.7)
Neutrophils Relative %: 72 %
Platelets: 364 10*3/uL (ref 150–400)
RBC: 5.21 MIL/uL (ref 4.22–5.81)
RDW: 12.5 % (ref 11.5–15.5)
WBC: 9.5 10*3/uL (ref 4.0–10.5)
nRBC: 0 % (ref 0.0–0.2)

## 2022-10-26 LAB — COMPREHENSIVE METABOLIC PANEL
ALT: 19 U/L (ref 0–44)
AST: 22 U/L (ref 15–41)
Albumin: 4 g/dL (ref 3.5–5.0)
Alkaline Phosphatase: 65 U/L (ref 38–126)
Anion gap: 12 (ref 5–15)
BUN: 24 mg/dL — ABNORMAL HIGH (ref 8–23)
CO2: 25 mmol/L (ref 22–32)
Calcium: 9.2 mg/dL (ref 8.9–10.3)
Chloride: 98 mmol/L (ref 98–111)
Creatinine, Ser: 1.12 mg/dL (ref 0.61–1.24)
GFR, Estimated: >60 mL/min (ref >60)
Glucose, Bld: 125 mg/dL — ABNORMAL HIGH (ref 70–99)
Potassium: 3.8 mmol/L (ref 3.5–5.1)
Sodium: 135 mmol/L (ref 135–145)
Total Bilirubin: 0.5 mg/dL (ref 0.3–1.2)
Total Protein: 8.4 g/dL — ABNORMAL HIGH (ref 6.5–8.1)

## 2022-10-26 LAB — CULTURE, BLOOD (ROUTINE X 2)

## 2022-10-26 LAB — LACTIC ACID, PLASMA
Lactic Acid, Venous: 1.4 mmol/L (ref 0.5–1.9)
Lactic Acid, Venous: 1.3 mmol/L (ref 0.5–1.9)

## 2022-10-26 LAB — SARS CORONAVIRUS 2 BY RT PCR: SARS Coronavirus 2 by RT PCR: NEGATIVE

## 2022-10-26 LAB — PROTIME-INR
INR: 0.9 (ref 0.8–1.2)
Prothrombin Time: 12.3 seconds (ref 11.4–15.2)

## 2022-10-26 MED ORDER — HYDROCOD POLI-CHLORPHE POLI ER 10-8 MG/5ML PO SUER: 5.0000 mL | Freq: Every evening | ORAL | 0 refills | Status: AC | PRN

## 2022-10-26 MED ORDER — IOHEXOL 350 MG/ML SOLN
75.0000 mL | Freq: Once | INTRAVENOUS | Status: AC | PRN
  Administered 2022-10-26: 75 mL via INTRAVENOUS

## 2022-10-26 MED ORDER — PREDNISONE 20 MG PO TABS: 40.0000 mg | ORAL_TABLET | Freq: Every day | ORAL | 0 refills | Status: AC

## 2022-10-26 MED ORDER — DOXYCYCLINE HYCLATE 100 MG PO CAPS: 100.0000 mg | ORAL_CAPSULE | Freq: Two times a day (BID) | ORAL | 0 refills | Status: AC

## 2022-10-26 NOTE — ED Provider Notes (Signed)
[place] EMERGENCY DEPARTMENT AT [facility] Provider Note   CSN: [id number] Arrival date & time: 10/26/22  1117     History  Chief Complaint  Patient presents with   Pneumonia    Patrick [name] is a 67 y.o. male.  He has a history of hypertension, vascular disease.  He has had a cough and weakness going on for 3 weeks.  He saw his doctor and was treated with amoxicillin cough medicine and prednisone.  He went back to the doctor a week later and was given a shot of Rocephin.  He continues to cough and feels worse.  Said he had 1 episode of a fever last week to 99.  Cough is been mostly nonproductive.  He said he was coughing so hard last night that he thought he was going to pass out.  Non-smoker no prior history of significant lung disease.  Said he also tested negative for COVID recently  The history is provided by the patient and the spouse.  Cough Cough characteristics:  Non-productive Sputum characteristics:  Nondescript Severity:  Severe Onset quality:  Gradual Duration:  3 weeks Timing:  Intermittent Progression:  Unchanged Chronicity:  New Smoker: no   Relieved by:  Nothing Worsened by:  Activity Ineffective treatments:  Cough suppressants Associated symptoms: fever and shortness of breath   Associated symptoms: no chest pain and no diaphoresis        Home Medications Prior to Admission medications   Medication Sig Start Date End Date Taking? Authorizing Provider  aspirin EC 81 MG tablet Take 81 mg by mouth daily. Swallow whole.    [provider]  cephALEXin (KEFLEX) 500 MG capsule Take 1 capsule (500 mg total) by mouth 2 (two) times daily. 12/13/20   Sherren Kerns, MD  clopidogrel (PLAVIX) 75 MG tablet Take 75 mg by mouth daily.  Patient not taking: Reported on 12/13/2020 08/17/12   [provider]  DULoxetine (CYMBALTA) 60 MG capsule Take 60 mg by mouth daily. 06/05/19   [provider]  fluticasone (FLONASE) 50 MCG/ACT  nasal spray Place 1 spray into both nostrils daily as needed for allergies. 08/30/20   [provider]  omeprazole (PRILOSEC) 40 MG capsule Take 40 mg by mouth daily.    [provider]  oxyCODONE-acetaminophen (PERCOCET/ROXICET) 5-325 MG tablet Take 1 tablet by mouth every 6 (six) hours as needed for moderate pain. Patient not taking: Reported on 12/13/2020 10/24/20   Emilie Rutter, PA-C  potassium chloride SA (KLOR-CON) 20 MEQ tablet Take 20 mEq by mouth daily. 01/14/20   [provider]  quinapril-hydrochlorothiazide (ACCURETIC) 20-25 MG per tablet Take 1 tablet by mouth daily.    [provider]  rosuvastatin (CRESTOR) 20 MG tablet Take 20 mg by mouth daily.    [provider]  topiramate (TOPAMAX) 100 MG tablet Take 100 mg by mouth daily. 12/13/19   [provider]      Allergies    Amitriptyline    Review of Systems   Review of Systems  Constitutional:  Positive for fever. Negative for diaphoresis.  Respiratory:  Positive for cough and shortness of breath.   Cardiovascular:  Negative for chest pain.  Neurological:  Positive for light-headedness.    Physical Exam Updated Vital Signs BP 118/77 (BP Location: Left Arm)   Pulse 95   Temp (!) 97.5 F (36.4 C)   Resp (!) 24   SpO2 100%  Physical Exam Vitals and nursing note reviewed.  Constitutional:  General: He is not in acute distress.    Appearance: Normal appearance. He is well-developed.  HENT:     Head: Normocephalic and atraumatic.  Eyes:     Conjunctiva/sclera: Conjunctivae normal.  Cardiovascular:     Rate and Rhythm: Normal rate and regular rhythm.     Heart sounds: No murmur heard. Pulmonary:     Effort: Pulmonary effort is normal. No respiratory distress.     Breath sounds: Normal breath sounds.  Abdominal:     Palpations: Abdomen is soft.     Tenderness: There is no abdominal tenderness.  Musculoskeletal:        General: Normal range of motion.      Cervical back: Neck supple.     Right lower leg: No edema.     Left lower leg: No edema.  Skin:    General: Skin is warm and dry.     Capillary Refill: Capillary refill takes less than 2 seconds.  Neurological:     General: No focal deficit present.     Mental Status: He is alert.     ED Results / Procedures / Treatments   Labs (all labs ordered are listed, but only abnormal results are displayed) Labs Reviewed  COMPREHENSIVE METABOLIC PANEL - Abnormal; Notable for the following components:      Result Value   Glucose, Bld 125 (*)    BUN 24 (*)    Total Protein 8.4 (*)    All other components within normal limits  URINALYSIS, ROUTINE W REFLEX MICROSCOPIC - Abnormal; Notable for the following components:   Protein, ur 30 (*)    All other components within normal limits  CULTURE, BLOOD (ROUTINE X 2)  CULTURE, BLOOD (ROUTINE X 2)  SARS CORONAVIRUS 2 BY RT PCR  LACTIC ACID, PLASMA  LACTIC ACID, PLASMA  CBC WITH DIFFERENTIAL/PLATELET  PROTIME-INR    EKG EKG Interpretation  Date/Time:  Sunday October 26 2022 11:30:39 EDT Ventricular Rate:  92 PR Interval:  132 QRS Duration: 138 QT Interval:  388 QTC Calculation: 479 R Axis:   92 Text Interpretation: Normal sinus rhythm Right bundle branch block Abnormal ECG No significant change since prior 4/22 Confirmed by Meridee Score [phone]) on 10/26/2022 11:54:45 AM  Radiology CT Angio Chest PE W/Cm &/Or Wo Cm  Result Date: 10/26/2022 CLINICAL DATA:  Chest pain and weakness. EXAM: CT ANGIOGRAPHY CHEST WITH CONTRAST TECHNIQUE: Multidetector CT imaging of the chest was performed using the standard protocol during bolus administration of intravenous contrast. Multiplanar CT image reconstructions and MIPs were obtained to evaluate the vascular anatomy. RADIATION DOSE REDUCTION: This exam was performed according to the departmental dose-optimization program which includes automated exposure control, adjustment of the mA and/or kV according  to patient size and/or use of iterative reconstruction technique. CONTRAST:  75mL OMNIPAQUE IOHEXOL 350 MG/ML SOLN COMPARISON:  Chest x-ray, same date. FINDINGS: Cardiovascular: The heart is normal in size. No pericardial effusion. The aorta is normal in caliber. No dissection. Scattered age advanced atherosclerotic calcifications. Scattered coronary artery calcifications. The pulmonary arterial tree is well opacified. No filling defects to suggest pulmonary embolism. Mediastinum/Nodes: No mediastinal or hilar mass or lymphadenopathy. The esophagus is unremarkable. The thyroid gland is normal. Lungs/Pleura: Patchy mosaic pattern of ground-glass attenuation typically seen with small airways disease such as asthma or constrictive bronchiolitis. Other possibilities would include cryptogenic organizing pneumonia or hypersensitivity pneumonitis. Atypical/viral pneumonia is another possibility. Somewhat rounded ground-glass nodularity in both lower lobes, likely part of the same process. Recommend a follow-up  noncontrast chest CT after appropriate treatment in 3 months to reassess. No focal airspace consolidation. No pleural effusions or pleural nodules. No pneumothorax. The central tracheobronchial tree is unremarkable. Upper Abdomen: No significant upper abdominal findings. Moderate amount of scattered fluid in the colon is typically seen with diarrheal disease is. Musculoskeletal: The bony thorax is unremarkable. Remote L1 compression fracture. Review of the MIP images confirms the above findings. IMPRESSION: 1. No CT findings for pulmonary embolism. 2. Normal caliber thoracic aorta without dissection. Age advanced atherosclerotic calcification. 3. Patchy mosaic pattern of ground-glass attenuation and more nodular appearing ground-glass disease in the lower lobes. This is most typically seen with small airways disease such as asthma or constrictive bronchiolitis. Other possibilities would include cryptogenic organizing  pneumonia or hypersensitivity pneumonitis. Atypical/viral pneumonia is another possibility. Recommend a follow-up noncontrast chest CT after appropriate treatment in 3 months to reassess. 4. No mediastinal or hilar mass or adenopathy. 5. Moderate amount of scattered fluid in the colon is typically seen with diarrheal diseases. Electronically Signed   By: Rudie Meyer M.D.   On: 10/26/2022 13:26   DG Chest 2 View  Result Date: 10/26/2022 CLINICAL DATA:  Sepsis EXAM: CHEST - 2 VIEW COMPARISON:  10/23/2022 FINDINGS: The heart size and mediastinal contours are within normal limits. Low lung volumes. No focal airspace consolidation, pleural effusion, or pneumothorax. Degenerative changes of both shoulders. Superior endplate compression deformity of L1. Per lumbar spine x-ray report from 02/20/2020, this finding was described on that exam. Images were not immediately available for comparison at the time of dictation. IMPRESSION: 1. Low lung volumes. No acute cardiopulmonary findings. 2. Chronic superior endplate compression deformity of L1. Electronically Signed   By: Duanne Guess D.O.   On: 10/26/2022 11:53    Procedures Procedures    Medications Ordered in ED Medications  iohexol (OMNIPAQUE) 350 MG/ML injection 75 mL (75 mLs Intravenous Contrast Given 10/26/22 1309)    ED Course/ Medical Decision Making/ A&P Clinical Course as of 10/26/22 1658  Sun Oct 26, 2022  1156 Chest x-ray interpreted by me as no acute infiltrate.  Awaiting radiology reading. [MB]    Clinical Course User Index [MB] Terrilee Files, MD                             Medical Decision Making Amount and/or Complexity of Data Reviewed Labs: ordered. Radiology: ordered.  Risk Prescription drug management.   This patient complains of cough shortness of breath; this involves an extensive number of treatment Options and is a complaint that carries with it a high risk of complications and morbidity. The differential  includes bronchitis, pneumonia, COVID, flu, reactive airway disease, PE  I ordered, reviewed and interpreted labs, which included CBC with normal white count normal hemoglobin, chemistries LFTs fairly normal, lactate normal, COVID-negative I ordered imaging studies which included chest x-ray and CT angio chest and I independently    visualized and interpreted imaging which showed no PE.  Does have groundglass opacities cannot exclude infection versus inflammation Additional history obtained from patient's wife Previous records obtained and reviewed in epic including PCP visits Cardiac monitoring reviewed, normal sinus rhythm Social determinants considered, no significant barriers Critical Interventions: None  After the interventions stated above, I reevaluated the patient and found patient to be well-appearing oxygenating well in no distress Admission and further testing considered, no indications for admission at this time.  Will be treated with antibiotics and steroids and given  contact information for pulmonary clinic.  Return instructions discussed.         Final Clinical Impression(s) / ED Diagnoses Final diagnoses:  Bronchitis  Acute cough    Rx / DC Orders ED Discharge Orders          Ordered    doxycycline (VIBRAMYCIN) 100 MG capsule  2 times daily        10/26/22 1348    predniSONE (DELTASONE) 20 MG tablet  Daily        10/26/22 1348    chlorpheniramine-HYDROcodone (TUSSIONEX) 10-8 MG/5ML  At bedtime PRN        10/26/22 1348              Terrilee Files, MD 10/26/22 1700

## 2022-10-26 NOTE — ED Notes (Signed)
Patient transported to CT 

## 2022-10-26 NOTE — ED Notes (Signed)
Pt verbalized understanding of discharge instructions. Opportunity for questions provided.  

## 2022-10-26 NOTE — ED Triage Notes (Signed)
Pt presents with URI for over a week and was diagnosed with pneumonia on 4/18. Pt has been treated with amoxicillin and a Rocephin injection. Today pt symptoms worsene with increasing weakness and ShOB. Pt states he HR and SpO2 was low at home.

## 2022-10-27 LAB — CULTURE, BLOOD (ROUTINE X 2)

## 2022-10-29 DIAGNOSIS — J309 Allergic rhinitis, unspecified: Secondary | ICD-10-CM | POA: Diagnosis not present

## 2022-10-29 DIAGNOSIS — R059 Cough, unspecified: Secondary | ICD-10-CM | POA: Diagnosis not present

## 2022-10-30 LAB — CULTURE, BLOOD (ROUTINE X 2): Culture: NO GROWTH

## 2022-10-31 LAB — CULTURE, BLOOD (ROUTINE X 2)
Special Requests: ADEQUATE
Special Requests: ADEQUATE

## 2022-11-19 DIAGNOSIS — R059 Cough, unspecified: Secondary | ICD-10-CM | POA: Diagnosis not present

## 2023-01-07 DIAGNOSIS — E876 Hypokalemia: Secondary | ICD-10-CM | POA: Diagnosis not present

## 2023-01-07 DIAGNOSIS — E1165 Type 2 diabetes mellitus with hyperglycemia: Secondary | ICD-10-CM | POA: Diagnosis not present

## 2023-01-07 DIAGNOSIS — E7849 Other hyperlipidemia: Secondary | ICD-10-CM | POA: Diagnosis not present

## 2023-01-07 DIAGNOSIS — I10 Essential (primary) hypertension: Secondary | ICD-10-CM | POA: Diagnosis not present

## 2023-01-07 DIAGNOSIS — N183 Chronic kidney disease, stage 3 unspecified: Secondary | ICD-10-CM | POA: Diagnosis not present

## 2023-01-12 DIAGNOSIS — G44309 Post-traumatic headache, unspecified, not intractable: Secondary | ICD-10-CM | POA: Diagnosis not present

## 2023-01-12 DIAGNOSIS — G44329 Chronic post-traumatic headache, not intractable: Secondary | ICD-10-CM | POA: Diagnosis not present

## 2023-01-12 DIAGNOSIS — I10 Essential (primary) hypertension: Secondary | ICD-10-CM | POA: Diagnosis not present

## 2023-01-12 DIAGNOSIS — R7301 Impaired fasting glucose: Secondary | ICD-10-CM | POA: Diagnosis not present

## 2023-01-12 DIAGNOSIS — R42 Dizziness and giddiness: Secondary | ICD-10-CM | POA: Diagnosis not present

## 2023-01-12 DIAGNOSIS — Z23 Encounter for immunization: Secondary | ICD-10-CM | POA: Diagnosis not present

## 2023-01-12 DIAGNOSIS — E7849 Other hyperlipidemia: Secondary | ICD-10-CM | POA: Diagnosis not present

## 2023-01-12 DIAGNOSIS — R7303 Prediabetes: Secondary | ICD-10-CM | POA: Diagnosis not present

## 2023-01-12 DIAGNOSIS — I6522 Occlusion and stenosis of left carotid artery: Secondary | ICD-10-CM | POA: Diagnosis not present

## 2023-01-12 DIAGNOSIS — E876 Hypokalemia: Secondary | ICD-10-CM | POA: Diagnosis not present

## 2023-01-12 DIAGNOSIS — Z0001 Encounter for general adult medical examination with abnormal findings: Secondary | ICD-10-CM | POA: Diagnosis not present

## 2023-02-05 ENCOUNTER — Other Ambulatory Visit: Payer: Self-pay

## 2023-02-05 ENCOUNTER — Telehealth: Payer: Self-pay

## 2023-02-05 DIAGNOSIS — I6523 Occlusion and stenosis of bilateral carotid arteries: Secondary | ICD-10-CM

## 2023-02-05 NOTE — Telephone Encounter (Signed)
Pt's wife called with pt there to schedule his carotid artery f/u. He had R CEA in 2022 and woke up today with soreness in that area. Denies any redness or swelling. Pt is overdue for his f/u and has been scheduled for this. They will call us back should anything change/worsen in the meantime.

## 2023-02-09 ENCOUNTER — Ambulatory Visit (HOSPITAL_COMMUNITY)
Admission: RE | Admit: 2023-02-09 | Discharge: 2023-02-09 | Disposition: A | Discharge status: Home / Self Care | Payer: Medicare Other | Source: Ambulatory Visit | Attending: Vascular Surgery | Admitting: Vascular Surgery

## 2023-02-09 DIAGNOSIS — I6523 Occlusion and stenosis of bilateral carotid arteries: Secondary | ICD-10-CM | POA: Insufficient documentation

## 2023-02-16 ENCOUNTER — Ambulatory Visit: Payer: Medicare Other | Admitting: Physician Assistant

## 2023-02-16 VITALS — BP 147/85 | HR 66 | Temp 97.8°F | Resp 16 | Ht 68.0 in | Wt 178.8 lb

## 2023-02-16 DIAGNOSIS — I6523 Occlusion and stenosis of bilateral carotid arteries: Secondary | ICD-10-CM
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
## 2023-02-19 ENCOUNTER — Other Ambulatory Visit: Payer: Self-pay

## 2023-02-19 DIAGNOSIS — I6523 Occlusion and stenosis of bilateral carotid arteries: Secondary | ICD-10-CM

## 2023-04-23 DIAGNOSIS — Z23 Encounter for immunization: Secondary | ICD-10-CM | POA: Diagnosis not present

## 2023-07-06 DIAGNOSIS — R7301 Impaired fasting glucose: Secondary | ICD-10-CM | POA: Diagnosis not present

## 2023-07-06 DIAGNOSIS — N183 Chronic kidney disease, stage 3 unspecified: Secondary | ICD-10-CM | POA: Diagnosis not present

## 2023-07-06 DIAGNOSIS — E7849 Other hyperlipidemia: Secondary | ICD-10-CM | POA: Diagnosis not present

## 2023-07-06 DIAGNOSIS — R7303 Prediabetes: Secondary | ICD-10-CM | POA: Diagnosis not present

## 2023-07-15 DIAGNOSIS — I10 Essential (primary) hypertension: Secondary | ICD-10-CM | POA: Diagnosis not present

## 2023-07-15 DIAGNOSIS — G3184 Mild cognitive impairment, so stated: Secondary | ICD-10-CM | POA: Diagnosis not present

## 2023-07-15 DIAGNOSIS — R7303 Prediabetes: Secondary | ICD-10-CM | POA: Diagnosis not present

## 2023-07-15 DIAGNOSIS — E876 Hypokalemia: Secondary | ICD-10-CM | POA: Diagnosis not present

## 2023-07-15 DIAGNOSIS — R7301 Impaired fasting glucose: Secondary | ICD-10-CM | POA: Diagnosis not present

## 2023-07-15 DIAGNOSIS — I6522 Occlusion and stenosis of left carotid artery: Secondary | ICD-10-CM | POA: Diagnosis not present

## 2023-07-15 DIAGNOSIS — G44329 Chronic post-traumatic headache, not intractable: Secondary | ICD-10-CM | POA: Diagnosis not present

## 2023-07-15 DIAGNOSIS — N189 Chronic kidney disease, unspecified: Secondary | ICD-10-CM | POA: Diagnosis not present

## 2023-07-15 DIAGNOSIS — R4781 Slurred speech: Secondary | ICD-10-CM | POA: Diagnosis not present

## 2023-07-15 DIAGNOSIS — R42 Dizziness and giddiness: Secondary | ICD-10-CM | POA: Diagnosis not present

## 2023-07-15 DIAGNOSIS — G44309 Post-traumatic headache, unspecified, not intractable: Secondary | ICD-10-CM | POA: Diagnosis not present

## 2024-01-05 DIAGNOSIS — E1165 Type 2 diabetes mellitus with hyperglycemia: Secondary | ICD-10-CM | POA: Diagnosis not present

## 2024-01-05 DIAGNOSIS — E786 Lipoprotein deficiency: Secondary | ICD-10-CM | POA: Diagnosis not present

## 2024-01-12 DIAGNOSIS — Z1389 Encounter for screening for other disorder: Secondary | ICD-10-CM | POA: Diagnosis not present

## 2024-01-12 DIAGNOSIS — Z23 Encounter for immunization: Secondary | ICD-10-CM | POA: Diagnosis not present

## 2024-01-12 DIAGNOSIS — R7303 Prediabetes: Secondary | ICD-10-CM | POA: Diagnosis not present

## 2024-01-12 DIAGNOSIS — Z0000 Encounter for general adult medical examination without abnormal findings: Secondary | ICD-10-CM | POA: Diagnosis not present

## 2024-01-12 DIAGNOSIS — Z0001 Encounter for general adult medical examination with abnormal findings: Secondary | ICD-10-CM | POA: Diagnosis not present

## 2024-01-12 DIAGNOSIS — Z8673 Personal history of transient ischemic attack (TIA), and cerebral infarction without residual deficits: Secondary | ICD-10-CM | POA: Diagnosis not present

## 2024-01-12 DIAGNOSIS — I779 Disorder of arteries and arterioles, unspecified: Secondary | ICD-10-CM | POA: Diagnosis not present

## 2024-01-12 DIAGNOSIS — G44329 Chronic post-traumatic headache, not intractable: Secondary | ICD-10-CM | POA: Diagnosis not present
